# Patient Record
Sex: Male | Born: 1970 | Race: Black or African American | Hispanic: No | Marital: Single | Smoking: Former smoker
Health system: Southern US, Community
[De-identification: ages and names within clinical notes are randomized; demographics above are authoritative.]

## PROBLEM LIST (undated history)

## (undated) ENCOUNTER — Ambulatory Visit: Admission: EM | Source: Home / Self Care

## (undated) DIAGNOSIS — K625 Hemorrhage of anus and rectum: Secondary | ICD-10-CM

## (undated) DIAGNOSIS — I10 Essential (primary) hypertension: Secondary | ICD-10-CM

## (undated) DIAGNOSIS — L259 Unspecified contact dermatitis, unspecified cause: Secondary | ICD-10-CM

## (undated) DIAGNOSIS — M25519 Pain in unspecified shoulder: Secondary | ICD-10-CM

## (undated) DIAGNOSIS — M6281 Muscle weakness (generalized): Secondary | ICD-10-CM

## (undated) HISTORY — PX: TONSILLECTOMY: SUR1361

## (undated) HISTORY — DX: Hemorrhage of anus and rectum: K62.5

## (undated) HISTORY — DX: Unspecified contact dermatitis, unspecified cause: L25.9

## (undated) HISTORY — DX: Muscle weakness (generalized): M62.81

## (undated) HISTORY — DX: Essential (primary) hypertension: I10

## (undated) HISTORY — PX: ROTATOR CUFF REPAIR: SHX139

## (undated) HISTORY — PX: ACHILLES TENDON REPAIR: SUR1153

## (undated) HISTORY — DX: Pain in unspecified shoulder: M25.519

## (undated) SURGERY — COLONOSCOPY
Anesthesia: Moderate Sedation

---

## 2005-05-04 ENCOUNTER — Ambulatory Visit: Payer: Self-pay | Admitting: Orthopedic Surgery

## 2006-06-15 ENCOUNTER — Ambulatory Visit (HOSPITAL_BASED_OUTPATIENT_CLINIC_OR_DEPARTMENT_OTHER): Admission: RE | Admit: 2006-06-15 | Discharge: 2006-06-15 | Payer: Self-pay | Admitting: Orthopedic Surgery

## 2007-08-15 ENCOUNTER — Emergency Department (HOSPITAL_COMMUNITY): Admission: EM | Admit: 2007-08-15 | Discharge: 2007-08-15 | Payer: Self-pay | Admitting: Emergency Medicine

## 2007-08-17 ENCOUNTER — Encounter: Admission: RE | Admit: 2007-08-17 | Discharge: 2007-08-17 | Payer: Self-pay | Admitting: Sports Medicine

## 2007-08-19 ENCOUNTER — Encounter: Admission: RE | Admit: 2007-08-19 | Discharge: 2007-08-19 | Payer: Self-pay | Admitting: Sports Medicine

## 2010-05-23 ENCOUNTER — Emergency Department (HOSPITAL_COMMUNITY): Admission: EM | Admit: 2010-05-23 | Discharge: 2010-05-23 | Payer: Self-pay | Admitting: Emergency Medicine

## 2011-02-28 LAB — URINALYSIS, ROUTINE W REFLEX MICROSCOPIC
Bilirubin Urine: NEGATIVE
Glucose, UA: NEGATIVE mg/dL
Nitrite: NEGATIVE
Specific Gravity, Urine: >1.03 — ABNORMAL HIGH (ref 1.005–1.030)
pH: 5.5 (ref 5.0–8.0)

## 2011-02-28 LAB — HEPATIC FUNCTION PANEL
ALT: 37 U/L (ref 0–53)
AST: 38 U/L — ABNORMAL HIGH (ref 0–37)
Albumin: 4.2 g/dL (ref 3.5–5.2)
Alkaline Phosphatase: 74 U/L (ref 39–117)
Total Protein: 7.3 g/dL (ref 6.0–8.3)

## 2011-02-28 LAB — DIFFERENTIAL
Lymphocytes Relative: 38 % (ref 12–46)
Lymphs Abs: 2.8 10*3/uL (ref 0.7–4.0)
Neutrophils Relative %: 51 % (ref 43–77)

## 2011-02-28 LAB — BASIC METABOLIC PANEL
BUN: 11 mg/dL (ref 6–23)
Creatinine, Ser: 1.26 mg/dL (ref 0.4–1.5)
GFR calc non Af Amer: >60 mL/min (ref >60)
Potassium: 3.8 mEq/L (ref 3.5–5.1)

## 2011-02-28 LAB — CBC
HCT: 48.3 % (ref 39.0–52.0)
Platelets: 209 10*3/uL (ref 150–400)
WBC: 7.3 10*3/uL (ref 4.0–10.5)

## 2011-02-28 LAB — URINE MICROSCOPIC-ADD ON

## 2011-04-29 NOTE — Op Note (Signed)
NAMESIDHANT, HELDERMAN            ACCOUNT NO.:  [id number]   MEDICAL RECORD NO.:  [id number]          PATIENT TYPE:  AMB   LOCATION:  DSC                          FACILITY:  [facility]   PHYSICIAN:  Cindee Salt, M.D.       DATE OF BIRTH:  [birth year]   DATE OF PROCEDURE:  06/15/2006  DATE OF DISCHARGE:                                 OPERATIVE REPORT   PREOPERATIVE DIAGNOSIS:  Wrist pain right wrist with probable lunotriquetral  tear.   POSTOPERATIVE DIAGNOSIS:  Wrist pain right wrist with probable  lunotriquetral tear plus injury to the dorsal radiocarpal ligament.   OPERATION:  Right wrist arthroscopy with shrinkage of dorsal capsule, dorsal  radiocarpal ligament, lunotriquetral joint.  Avulsion of dorsal midcarpal  capsule right wrist.   SURGEON:  Dr. Merlyn Lot   ASSISTANT:  RN   ANESTHESIA:  General.   HISTORY:  The patient is a 40 year old male who suffered an injury to his  right wrist.  He has had pain which has not responded to conservative  treatment.  This is ulnar-sided; MRI reveals contrast agent in the dorsal  ulnar aspect of his proximal and mid carpal joints.   PROCEDURE:  The patient was brought to the operating room where an axillary  block was carried out.  This was supplemented with a general anesthetic.  This was done after marking the area of procedure by both the patient and  surgeon.  Questions were encouraged and answered, risks and complications of  the surgery having been discussed preoperatively.  The patient was brought  to the operating room where he was given a general anesthetic, prepped using  DuraPrep.  After 3-minute dry time, he was draped.  The limb was placed in  the arthroscopy tower, 10 pounds of traction applied, the joint inflated to  3/4 portal.  A transverse incision was made through skin only, deepened with  hemostat.  Blunt trocar was used enter the joint.  The joint was inspected.  The volar radiocarpal ligaments were intact.  The  scapholunate ligament was  intact.  There was a large prestyloid recess present.  Triangle  fibrocartilage complex was intact.  An irrigation catheter was placed in 6U,  a 4/5 portal opened after localization with a 22 gauge needle.  Probe was  then introduced.  The triangle fibrocartilage complex a showed normal  trampoline effect.  The dorsal carpal ligament complex showed fraying.  The  midcarpal joint was then inspected after localization inflation with a 22  gauge needle through the radial midcarpal portal.  The portal was opened  transversely, deepened with a hemostat.  Blunt trocar was used enter the  joint.  The joint was inspected.  The STT joint showed no changes.  There  were no articular changes on the distal pole of scaphoid lunate triquetrum  or the proximal aspect of the capitate or hamate.  Significant laxity was  present to the lunotriquetral joint.  Significant material was present on  the dorsal capsule in-folded into the joint.  A mid ulnar portal was then  opened after localization with a 22 gauge needle and  an irrigation catheter  placed in the ulnar midcarpal joint using the 18 gauge needle.  A probe was  introduced.  Significant laxity again was noted to the lunotriquetral joint.  The dorsal and radial carpal ligament was found to be intact to the  triquetrum.  Significant stripping of the dorsal capsule was present from  the hamate and capitate was found to be attached distally.  The area was  then debrided with an Merton Border wand with adequate flow.  Shrinkage performed  of the dorsal capsule of the midcarpal joint had instruments removed, and  these were introduced in the ulnar side of the wrist for observation of the  lunotriquetral joint.  Capsule and ligament were intact but stretched.  A  scope was then placed in the 3/4 portal, a shrinkage of the dorsal capsule.  The lunotriquetral joint was then performed with an Merton Border wand with  adequate flow.  No further  lesions were identified.  The midcarpal joint was  inspected.  Significant tightening of the lunotriquetral joint was present.  The instruments were removed, the portals closed with interrupted 5-0 nylon  sutures.  Sterile compressive dressing and dorsal palmar splint applied.  The patient tolerated the procedure well and was taken to the recovery room  for observation in satisfactory condition.  He is discharged home drains to  return to the [facility] in 1 week on Vicodin.           ______________________________  Cindee Salt, M.D.     GK/MEDQ  D:  06/15/2006  T:  06/15/2006  Job:  [id number]

## 2011-05-21 ENCOUNTER — Emergency Department (HOSPITAL_COMMUNITY)
Admission: EM | Admit: 2011-05-21 | Discharge: 2011-05-21 | Disposition: A | Discharge status: Home / Self Care | Payer: 59 | Attending: Emergency Medicine | Admitting: Emergency Medicine

## 2011-05-21 DIAGNOSIS — R112 Nausea with vomiting, unspecified: Secondary | ICD-10-CM | POA: Insufficient documentation

## 2011-05-21 DIAGNOSIS — R197 Diarrhea, unspecified: Secondary | ICD-10-CM | POA: Insufficient documentation

## 2012-08-22 ENCOUNTER — Encounter (HOSPITAL_COMMUNITY): Payer: Self-pay | Admitting: Emergency Medicine

## 2012-08-22 ENCOUNTER — Emergency Department (HOSPITAL_COMMUNITY)
Admission: EM | Admit: 2012-08-22 | Discharge: 2012-08-22 | Disposition: A | Discharge status: Home / Self Care | Payer: Worker's Compensation | Attending: Emergency Medicine | Admitting: Emergency Medicine

## 2012-08-22 ENCOUNTER — Emergency Department (HOSPITAL_COMMUNITY): Payer: Worker's Compensation

## 2012-08-22 DIAGNOSIS — Y9301 Activity, walking, marching and hiking: Secondary | ICD-10-CM | POA: Insufficient documentation

## 2012-08-22 DIAGNOSIS — Y998 Other external cause status: Secondary | ICD-10-CM | POA: Insufficient documentation

## 2012-08-22 DIAGNOSIS — S93402A Sprain of unspecified ligament of left ankle, initial encounter: Secondary | ICD-10-CM

## 2012-08-22 DIAGNOSIS — X58XXXA Exposure to other specified factors, initial encounter: Secondary | ICD-10-CM | POA: Insufficient documentation

## 2012-08-22 DIAGNOSIS — S93409A Sprain of unspecified ligament of unspecified ankle, initial encounter: Secondary | ICD-10-CM | POA: Insufficient documentation

## 2012-08-22 MED ORDER — HYDROCODONE-ACETAMINOPHEN 5-325 MG PO TABS
1.0000 | ORAL_TABLET | Freq: Once | ORAL | Status: AC
  Administered 2012-08-22: 1 via ORAL
  Filled 2012-08-22: qty 1

## 2012-08-22 MED ORDER — IBUPROFEN 800 MG PO TABS: 800.0000 mg | ORAL_TABLET | Freq: Three times a day (TID) | ORAL | Status: AC

## 2012-08-22 NOTE — ED Notes (Signed)
To xray via w/c.

## 2012-08-22 NOTE — ED Notes (Signed)
PT. REPORTS PAIN / SWELLING AT LEFT ANKLE ACCIDENTALLY TWISTED WHILE WORKING ( [employer] [place]) THIS MORNING , AMBULATORY.

## 2012-08-22 NOTE — ED Provider Notes (Signed)
History     CSN: [id number]  Arrival date & time 08/22/12  0248   First MD Initiated Contact with Patient 08/22/12 0327      Chief Complaint  Patient presents with  . Ankle Pain    (Consider location/radiation/quality/duration/timing/severity/associated sxs/prior treatment) HPI History provided by pt.   Pt had an inversion injury to left foot approx 1.5hrs ago when he accidentally stepped on a bottle. Pain at lateral ankle is 6/10 currently.  Associated w/ edema; no paresthesias.  Able to bear weight.     History reviewed. No pertinent past medical history.  Past Surgical History  Procedure Date  . Tonsillectomy     No family history on file.  History  Substance Use Topics  . Smoking status: Never Smoker   . Smokeless tobacco: Not on file  . Alcohol Use: No      Review of Systems  All other systems reviewed and are negative.    Allergies  Review of patient's allergies indicates no known allergies.  Home Medications  No current outpatient prescriptions on file.  BP 137/73  Pulse 62  Temp 97.9 F (36.6 C) (Oral)  Resp 16  SpO2 96%  Physical Exam  Nursing note and vitals reviewed. Constitutional: He is oriented to person, place, and time. He appears well-developed and well-nourished. No distress.  HENT:  Head: Normocephalic and atraumatic.  Eyes:       Normal appearance  Neck: Normal range of motion.  Pulmonary/Chest: Effort normal.  Musculoskeletal: Normal range of motion.       L lateral ankle w/ mild edema; no ecchymosis.  Tenderness over and inferior to lateral malleolus.  Pain w/ passive medial rotation of foot only.  2+ DP pulse and distal sensation intact.  Nml L knee exam.   Neurological: He is alert and oriented to person, place, and time.  Psychiatric: He has a normal mood and affect. His behavior is normal.    ED Course  Procedures (including critical care time)  Labs Reviewed - No data to display Dg Ankle Complete Left  08/22/2012   *RADIOLOGY REPORT*  Clinical Data: Pain after twisting injury.  LEFT ANKLE COMPLETE - 3+ VIEW  Comparison: None.  Findings: Chronic.  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  No acute fracture or subluxation.  No focal bone lesion or bone destruction otherwise identified.  IMPRESSION: Chronic medial malleolar changes.  No acute fractures identified.   Original Report Authenticated By: Marlon Pel, M.D.      1. Sprain of left ankle       MDM  Healthy 41yo M presents w/ inversion injury to left ankle.  Xray neg for acute fx/dislocation.  Pt received one vicodin in ED and d/c'd home w/ ibuprofen.  Ortho tech placed in ASO.   Recommended RICE.          Otilio Miu, [place] 08/22/12 [phone]

## 2012-08-23 NOTE — ED Provider Notes (Signed)
Medical screening examination/treatment/procedure(s) were performed by non-physician practitioner and as supervising physician I was immediately available for consultation/collaboration.    Kayana Thoen L Beverlyn Mcginness, MD 08/23/12 0005 

## 2012-08-27 ENCOUNTER — Other Ambulatory Visit: Payer: Self-pay | Admitting: Medical

## 2012-08-27 NOTE — Telephone Encounter (Signed)
Is this okay to refill? 

## 2012-08-27 NOTE — Telephone Encounter (Signed)
The patient hasn't been seen in over a year. Have her schedule an appointment

## 2012-11-23 ENCOUNTER — Ambulatory Visit (HOSPITAL_COMMUNITY)
Admission: RE | Admit: 2012-11-23 | Discharge: 2012-11-23 | Disposition: A | Discharge status: Home / Self Care | Payer: 59 | Source: Ambulatory Visit | Attending: Orthopedic Surgery | Admitting: Orthopedic Surgery

## 2012-11-23 DIAGNOSIS — M6281 Muscle weakness (generalized): Secondary | ICD-10-CM | POA: Insufficient documentation

## 2012-11-23 DIAGNOSIS — IMO0001 Reserved for inherently not codable concepts without codable children: Secondary | ICD-10-CM | POA: Insufficient documentation

## 2012-11-23 DIAGNOSIS — Z9889 Other specified postprocedural states: Secondary | ICD-10-CM | POA: Insufficient documentation

## 2012-11-23 DIAGNOSIS — M25519 Pain in unspecified shoulder: Secondary | ICD-10-CM | POA: Insufficient documentation

## 2012-11-23 DIAGNOSIS — M25619 Stiffness of unspecified shoulder, not elsewhere classified: Secondary | ICD-10-CM | POA: Insufficient documentation

## 2012-11-23 HISTORY — DX: Pain in unspecified shoulder: M25.519

## 2012-11-23 HISTORY — DX: Muscle weakness (generalized): M62.81

## 2012-11-23 NOTE — Evaluation (Signed)
Occupational Therapy Evaluation  Patient Details  Name: Wayne Weber MRN: [id number] Date of Birth: June 29, 1971  Today's Date: 11/23/2012 Time: 1015-1100 OT Time Calculation (min): 45 min OT Eval 1015-1030 15' Manual Therapy 1030-1045 Heat 1050-1100  Visit#: 1  of 36   Re-eval: 12/21/12  Assessment Diagnosis: S/P Right RCR Surgical Date: 10/08/12 Next MD Visit: unknown Prior Therapy: no  Past Medical History: No past medical history on file. Past Surgical History:  Past Surgical History  Procedure Date  . Tonsillectomy     Subjective S:  I just want to get back to a full life without pain in my shoulder.   Pertinent History: Mr. Girgenti began feeling burning pain in his right shoulder at night more than a year ago.  He consulted with Dr. Eulah Pont and had a right rotator cuff repair done on 10/08/12.  He has weaned from the sling and has been referred to occupational therapy for evaluation and treatment following Dr. Greig Right protocol, phase II. Limitations: 11/23/12 he is 6.5 weeks postop, but begin with Phase II (limit to 70 abd and 30 ER PROM) protocol, per MD order. Progess protocol as indicated on scanned protocol , 12/20 increase to 90 abd and 45 ER, 12/27 can begin AAROM with ER to 80, see scanned protocol for specific details. Special Tests: UEFI:  30/80 = 37% I level Patient Stated Goals: I want to get back to living an active life and pain free. Pain Assessment Currently in Pain?: Yes Pain Score:   5 Pain Orientation: Right;Anterior Pain Type: Acute pain Pain Radiating Towards: pain is 0 at rest, but increases at end of available range.    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, reaching overhead, pushing, pulling, climbing steps Leisure: Hobbies-yes (Comment) Comments: working out, Public house manager ADL/Vision/Perception ADL ADL Comments: He has not utilized his right arm with daily activities above waist height Dominant Hand: Right  Cognition/Observation Cognition Overall Cognitive Status: Appears within functional limits for tasks assessed  Sensation/Coordination/Edema Sensation Light Touch: Appears Intact Coordination Gross Motor Movements are Fluid and Coordinated: Yes Fine Motor Movements are Fluid and Coordinated: Yes  Additional Assessments RUE AROM (degrees) RUE Overall AROM Comments: assessed in supine Right Shoulder Flexion: 30 Degrees Right Shoulder ABduction: 35 Degrees Right Shoulder Internal Rotation: 80 Degrees Right Shoulder External Rotation: 0 Degrees RUE PROM (degrees) RUE Overall PROM Comments: assessed in supine, ER/IR with shoulder adducted Right Shoulder Flexion: 75 Degrees Right Shoulder ABduction: 70 Degrees Right Shoulder Internal Rotation: 80 Degrees Right Shoulder External Rotation: 0 Degrees RUE Strength RUE Overall Strength Comments: strength not assessed per protocol Palpation Palpation: moderate fascial restrictions noted in right scapular region, anterior deltoid region, and upper arm     Exercise/Treatments    Modalities Modalities: Moist Heat Manual Therapy Manual Therapy: Myofascial release Myofascial Release: MFR and manual stretching to right upper arm, scapular, and shoulder region to decrease pain and fascial restrictions and increase pain free mobility within constratints of protocol.   Moist Heat Therapy Number Minutes Moist Heat: 10 Minutes Moist Heat Location: Shoulder  Occupational Therapy Assessment and Plan OT Assessment and  Plan Clinical Impression Statement: A:  Patient with decreased  A/PROM, strength and increased pain and fascial restrictions in his dominant right shoulder s/p rotator cuff repair surgery on 10/08/12.  Patient's deficits are causing decreased I with B/IADLs, work, and leisure activities.  Pt will benefit from skilled therapeutic intervention in order to improve on the following deficits: Decreased range of motion;Decreased strength;Increased fascial restricitons;Increased muscle spasms;Pain;Impaired UE functional use Rehab Potential: Good OT Frequency: Min 3X/week OT Duration: 12 weeks OT Treatment/Interventions: Self-care/ADL training;Therapeutic exercise;Manual therapy;Patient/family education;Therapeutic activities;Modalities OT Plan: P:  Skilled OT intervention to decrease pain and restrictions in his right shoulder region and increase A/PROM, strength to [facility] for return to prior level of I with B/IADLs, leisure, and work activities.  Treatment Plan:  Follow Protocol.  PROM in supine, isometrics in supine.  Seated ext, row, elevation.  Inferior, anterior glides distraction, grade II mobilizations.  Progress per protocol.   Goals Short Term Goals Time to Complete Short Term Goals: 6 weeks Short Term Goal 1: Patient will be I with HEP. Short Term Goal 2: Patient will increase right shoulder PROM to [facility] for increased independence with upper body dressing. Short Term Goal 3: Patient will increase right shoulder strength to 3+/5 for increased ability to reach into overhead cabinets. Short Term Goal 4: Patient will decrease pain in his right shoulder to 2/10 when getting dressed. Short Term Goal 5: Patient will decrease fascial restrictions to minimal in his right shoulder region. Long Term Goals Time to Complete Long Term Goals: 12 weeks Long Term Goal 1: Patient will return to prior level of independence with daily activities, work, and leisure activities. Long Term Goal 2: Patient will increase  his right shoulder AROM to WNL in order to reach overhead at work. Long Term Goal 3: Patient will increase his right shoulder strength to 5/5 for increased independence lifting boxes overhead at work. Long Term Goal 4: Patient will decrease pain in his right shoulder to 1/10 when reaching overhead. Long Term Goal 5: Patient will decrease fascial restrictions to trace in right shoulder.  Problem List Patient Active Problem List  Diagnosis  . Pain in joint, shoulder region  . Status post rotator cuff repair  . Muscle weakness (generalized)    End of Session Activity Tolerance: Patient tolerated treatment well General Behavior During Session: [facility] for tasks performed Cognition: [facility] for tasks performed OT Plan of Care OT Home Exercise Plan: Pendulums, towel slides.   Shirlean Mylar, OTR/L  11/23/2012, 1:31 PM  Physician Documentation Your signature is required to indicate approval of the treatment plan as stated above.  Please sign and either send electronically or make a copy of this report for your files and return this physician signed original.  Please mark one 1.__approve of plan  2. ___approve of plan with the following conditions.   ______________________________                                                          _____________________ Physician Signature  Date  

## 2012-11-26 ENCOUNTER — Ambulatory Visit (HOSPITAL_COMMUNITY)
Admission: RE | Admit: 2012-11-26 | Discharge: 2012-11-26 | Disposition: A | Discharge status: Home / Self Care | Payer: 59 | Source: Ambulatory Visit | Attending: Orthopedic Surgery | Admitting: Orthopedic Surgery

## 2012-11-26 DIAGNOSIS — M6281 Muscle weakness (generalized): Secondary | ICD-10-CM

## 2012-11-26 DIAGNOSIS — Z9889 Other specified postprocedural states: Secondary | ICD-10-CM

## 2012-11-26 DIAGNOSIS — M25519 Pain in unspecified shoulder: Secondary | ICD-10-CM

## 2012-11-26 NOTE — Progress Notes (Signed)
Occupational Therapy Treatment Patient Details  Name: Wayne Weber MRN: [id number] Date of Birth: 20-Nov-1971  Today's Date: 11/26/2012 Time: 5284-1324 OT Time Calculation (min): 35 min Manual Therapy 403-425 22' Therapeutic Exercise 426-438 12'  Visit#: 2  of 36   Re-eval: 12/21/12 Assessment Diagnosis: S/P Right RCR Surgical Date: 10/08/12   Subjective Symptoms/Limitations Symptoms: S:  I can tell it is not healed, it is uncomfortable. Limitations: 11/23/12 he is 6.5 weeks postop, but begin with Phase II (limit to 70 abd and 30 ER PROM) protocol, per MD order. Progess protocol as indicated on scanned protocol , 12/20 increase to 90 abd and 45 ER, 12/27 can begin AAROM with ER to 80, see scanned protocol for specific details Pain Assessment Currently in Pain?: Yes Pain Score:   4 Pain Location: Shoulder Pain Orientation: Right Pain Type: Acute pain  Precautions/Restrictions  Precautions Precautions: Shoulder (RCR - refer to protocol in scanned portion of this chart)  Exercise/Treatments Supine Protraction: PROM;10 reps Horizontal ABduction: PROM;10 reps External Rotation: PROM;10 reps Internal Rotation: PROM;10 reps Flexion: PROM;10 reps ABduction: PROM;10 reps Seated Elevation: AROM;10 reps Extension: AROM;10 reps Row: AROM;10 reps ROM / Strengthening / Isometric Strengthening Anterior Glide: 5x5' Other ROM/Strengthening Exercises: inferior glide 5x5' Flexion: Supine;5X5" Extension: Supine;5X5" External Rotation: Supine;5X5" Internal Rotation: Supine;5X5" ABduction: Supine;5X5" ADduction: Supine;5X5"         Manual Therapy Manual Therapy: Myofascial release Myofascial Release: MFR and manual stretching to right upper arm, scapular, and shoulder region to decrease pain and fascial restrictions and increase pain free mobility within constratints of protocol  Occupational Therapy Assessment and Plan OT Assessment and Plan Clinical Impression  Statement: A:  PROM within limits of protocol, isometrics, scapular AROM and glides were added today.  Patient with no increase in pain just "feel uncomfortable" mostly in posterior shoulder/upper arm. OT Plan: P:  Increase reps and hold time with isometrics.   Goals Short Term Goals Time to Complete Short Term Goals: 6 weeks Short Term Goal 1: Patient will be I with HEP. Short Term Goal 2: Patient will increase right shoulder PROM to [facility] for increased independence with upper body dressing. Short Term Goal 3: Patient will increase right shoulder strength to 3+/5 for increased ability to reach into overhead cabinets. Short Term Goal 4: Patient will decrease pain in his right shoulder to 2/10 when getting dressed. Short Term Goal 5: Patient will decrease fascial restrictions to minimal in his right shoulder region. Long Term Goals Time to Complete Long Term Goals: 12 weeks Long Term Goal 1: Patient will return to prior level of independence with daily activities, work, and leisure activities. Long Term Goal 2: Patient will increase his right shoulder AROM to WNL in order to reach overhead at work. Long Term Goal 3: Patient will increase his right shoulder strength to 5/5 for increased independence lifting boxes overhead at work. Long Term Goal 4: Patient will decrease pain in his right shoulder to 1/10 when reaching overhead. Long Term Goal 5: Patient will decrease fascial restrictions to trace in right shoulder.  Problem List Patient Active Problem List  Diagnosis  . Pain in joint, shoulder region  . Status post rotator cuff repair  . Muscle weakness (generalized)    End of Session Activity Tolerance: Patient tolerated treatment well General Behavior During Session: [facility] for tasks performed Cognition: [facility] for tasks performed  GO    [name], Arnav Cregg L 11/26/2012, 4:51 PM

## 2012-11-28 ENCOUNTER — Ambulatory Visit (HOSPITAL_COMMUNITY)
Admission: RE | Admit: 2012-11-28 | Discharge: 2012-11-28 | Disposition: A | Discharge status: Home / Self Care | Payer: 59 | Source: Ambulatory Visit | Attending: Orthopedic Surgery | Admitting: Orthopedic Surgery

## 2012-11-28 DIAGNOSIS — M25519 Pain in unspecified shoulder: Secondary | ICD-10-CM

## 2012-11-28 DIAGNOSIS — M6281 Muscle weakness (generalized): Secondary | ICD-10-CM

## 2012-11-28 DIAGNOSIS — Z9889 Other specified postprocedural states: Secondary | ICD-10-CM

## 2012-11-28 NOTE — Progress Notes (Addendum)
Occupational Therapy Treatment Patient Details  Name: TEX CONROY MRN: [id number] Date of Birth: [birth year]  Today's Date: 11/28/2012 Time: 4098-1191 OT Time Calculation (min): 38 min Manual Therapy 4782-9562 27' Therapeutic Exercise 1308-6578 10'  Visit#: 3  of 36   Re-eval: 12/21/12 Assessment Diagnosis: S/P Right RCR Surgical Date: 10/08/12   Subjective Symptoms/Limitations Symptoms: S:  It feels about the same. Pain Assessment Currently in Pain?: Yes Pain Score:   4  Precautions/Restrictions  Precautions Precautions: Shoulder (RCR - refer to protocol in scanned portion of this chart)  Exercise/Treatments Supine Protraction: PROM;10 reps Horizontal ABduction: PROM;10 reps External Rotation: PROM;10 reps Internal Rotation: PROM;10 reps Flexion: PROM;10 reps ABduction: PROM;10 reps Seated Elevation: AROM;10 reps Extension: AROM;10 reps Row: AROM;10 reps Therapy Ball Flexion: 10 reps ABduction: 10 reps ROM / Strengthening / Isometric Strengthening Anterior Glide: 5x5' Other ROM/Strengthening Exercises: inferior glide 5x5' Flexion: Supine;5X5" Extension: Supine;5X5" External Rotation: Supine;5X5" Internal Rotation: Supine;5X5" ABduction: Supine;5X5" ADduction: Supine;5X5"    Manual Therapy Manual Therapy: Myofascial release Myofascial Release: MFR and manual stretching to right upper arm, scapular, and shoulder region to decrease pain and fascial restrictions and increase pain free mobility within constratints of protocol   Occupational Therapy Assessment and Plan OT Assessment and Plan Clinical Impression Statement: A:  Added ball stretches flexion and abduction within limits of protocol.  No change in pain after manual and exercises. OT Plan: P:  Initiate AAROM and pulleys on 12/27.   Goals Short Term Goals Time to Complete Short Term Goals: 6 weeks Short Term Goal 1: Patient will be I with HEP. Short Term Goal 2: Patient will increase right  shoulder PROM to [facility] for increased independence with upper body dressing. Short Term Goal 3: Patient will increase right shoulder strength to 3+/5 for increased ability to reach into overhead cabinets. Short Term Goal 4: Patient will decrease pain in his right shoulder to 2/10 when getting dressed. Short Term Goal 5: Patient will decrease fascial restrictions to minimal in his right shoulder region. Long Term Goals Time to Complete Long Term Goals: 12 weeks Long Term Goal 1: Patient will return to prior level of independence with daily activities, work, and leisure activities. Long Term Goal 2: Patient will increase his right shoulder AROM to WNL in order to reach overhead at work. Long Term Goal 3: Patient will increase his right shoulder strength to 5/5 for increased independence lifting boxes overhead at work. Long Term Goal 4: Patient will decrease pain in his right shoulder to 1/10 when reaching overhead. Long Term Goal 5: Patient will decrease fascial restrictions to trace in right shoulder.  Problem List Patient Active Problem List  Diagnosis  . Pain in joint, shoulder region  . Status post rotator cuff repair  . Muscle weakness (generalized)    End of Session Activity Tolerance: Patient tolerated treatment well General Behavior During Session: [facility] for tasks performed Cognition: [facility] for tasks performed  GO    [name], Vaniya Augspurger L 11/28/2012, 12:02 PM

## 2012-11-30 ENCOUNTER — Ambulatory Visit (HOSPITAL_COMMUNITY)
Admission: RE | Admit: 2012-11-30 | Discharge: 2012-11-30 | Disposition: A | Discharge status: Home / Self Care | Payer: 59 | Source: Ambulatory Visit | Attending: Orthopedic Surgery | Admitting: Orthopedic Surgery

## 2012-11-30 DIAGNOSIS — M25519 Pain in unspecified shoulder: Secondary | ICD-10-CM

## 2012-11-30 DIAGNOSIS — M6281 Muscle weakness (generalized): Secondary | ICD-10-CM

## 2012-11-30 DIAGNOSIS — Z9889 Other specified postprocedural states: Secondary | ICD-10-CM

## 2012-11-30 NOTE — Progress Notes (Signed)
Occupational Therapy Treatment Patient Details  Name: Wayne Weber MRN: [id number] Date of Birth: 05-Nov-1971  Today's Date: 11/30/2012 Time: 4098-1191 OT Time Calculation (min): 34 min Manual Therapy 4782-9562 17' Therapeutic Exercise 1024-1140 14'  Visit#: 4  of 36   Re-eval: 12/21/12 Assessment Diagnosis: S/P Right RCR Surgical Date: 10/08/12  Authorization:    Authorization Time Period:    Authorization Visit#:   of    Subjective Symptoms/Limitations Symptoms: S:  This arm is still not right. Pain Assessment Currently in Pain?: No/denies Pain Score: 0-No pain  Precautions/Restrictions  Precautions Precautions: Shoulder (RCR - refer to protocol in scanned portion of this chart)  Exercise/Treatments Supine Protraction: PROM;10 reps Horizontal ABduction: PROM;10 reps External Rotation: PROM;10 reps Internal Rotation: PROM;10 reps Flexion: PROM;10 reps ABduction: PROM;10 reps Seated Elevation: AROM;10 reps Extension: AROM;10 reps Row: AROM;10 reps Therapy Ball Flexion: 10 reps ABduction: 10 reps ROM / Strengthening / Isometric Strengthening Anterior Glide: 5x5' Other ROM/Strengthening Exercises: inferior glide 5x5' Flexion: Supine;5X5" Extension: Supine;5X5" External Rotation: Supine;5X5" Internal Rotation: Supine;5X5" ABduction: Supine;5X5" ADduction: Supine;5X5"        Manual Therapy Manual Therapy: Myofascial release Myofascial Release: MFR and manual stretching to right upper arm, scapular, and shoulder region to decrease pain and fascial restrictions and increase pain free mobility within constratints of protocol   Occupational Therapy Assessment and Plan OT Assessment and Plan Clinical Impression Statement: A:  Increased ADB PROM to 90 degrees and ER to 45 degrees per protocol.  Unable to get to ER limits secondary to tight capsule. OT Plan: P: Initiate AAROM and pulleys on 12/27   Goals Short Term Goals Time to Complete Short Term  Goals: 6 weeks Short Term Goal 1: Patient will be I with HEP. Short Term Goal 2: Patient will increase right shoulder PROM to [facility] for increased independence with upper body dressing. Short Term Goal 3: Patient will increase right shoulder strength to 3+/5 for increased ability to reach into overhead cabinets. Short Term Goal 4: Patient will decrease pain in his right shoulder to 2/10 when getting dressed. Short Term Goal 5: Patient will decrease fascial restrictions to minimal in his right shoulder region. Long Term Goals Time to Complete Long Term Goals: 12 weeks Long Term Goal 1: Patient will return to prior level of independence with daily activities, work, and leisure activities. Long Term Goal 2: Patient will increase his right shoulder AROM to WNL in order to reach overhead at work. Long Term Goal 3: Patient will increase his right shoulder strength to 5/5 for increased independence lifting boxes overhead at work. Long Term Goal 4: Patient will decrease pain in his right shoulder to 1/10 when reaching overhead. Long Term Goal 5: Patient will decrease fascial restrictions to trace in right shoulder.  Problem List Patient Active Problem List  Diagnosis  . Pain in joint, shoulder region  . Status post rotator cuff repair  . Muscle weakness (generalized)    End of Session Activity Tolerance: Patient tolerated treatment well General Behavior During Session: [facility] for tasks performed Cognition: [facility] for tasks performed  GO    [name], Wayne Weber L 11/30/2012, 11:44 AM

## 2012-12-04 ENCOUNTER — Ambulatory Visit (HOSPITAL_COMMUNITY)
Admission: RE | Admit: 2012-12-04 | Discharge: 2012-12-04 | Disposition: A | Discharge status: Home / Self Care | Payer: 59 | Source: Ambulatory Visit | Attending: Orthopedic Surgery | Admitting: Orthopedic Surgery

## 2012-12-04 DIAGNOSIS — M25519 Pain in unspecified shoulder: Secondary | ICD-10-CM

## 2012-12-04 DIAGNOSIS — M6281 Muscle weakness (generalized): Secondary | ICD-10-CM

## 2012-12-04 DIAGNOSIS — Z9889 Other specified postprocedural states: Secondary | ICD-10-CM

## 2012-12-04 NOTE — Progress Notes (Signed)
Occupational Therapy Treatment Patient Details  Name: Wayne Weber MRN: [id number] Date of Birth: Feb 02, 1971  Today's Date: 12/04/2012 Time: 9147-8295 OT Time Calculation (min): 29 min Manual Therapy 6213-0865 14' Therapeutic Exercise 7846-9629 14' Visit#: 5  of 36   Re-eval: 12/21/12 Assessment Diagnosis: S/P Right RCR Surgical Date: 10/08/12   Subjective Symptoms/Limitations Symptoms: S:   It just feels tight no real pain. Limitations: 11/23/12 he is 6.5 weeks postop, but begin with Phase II (limit to 70 abd and 30 ER PROM) protocol, per MD order. Progess protocol as indicated on scanned protocol , 12/20 increase to 90 abd and 45 ER, 12/27 can begin AAROM with ER to 80, see scanned protocol for specific details Pain Assessment Currently in Pain?: No/denies Pain Score: 0-No pain  Precautions/Restrictions  Precautions Precautions: Shoulder (RCR - refer to protocol in scanned portion of this chart)  Exercise/Treatments Supine Protraction: PROM;10 reps Horizontal ABduction: PROM;10 reps External Rotation: PROM;10 reps Internal Rotation: PROM;10 reps Flexion: PROM;10 reps ABduction: PROM;10 reps Seated Elevation: AROM;10 reps Extension: AROM;10 reps Row: AROM;10 reps Therapy Ball Flexion: 20 reps ABduction: 20 reps ROM / Strengthening / Isometric Strengthening Anterior Glide: 5x5' Other ROM/Strengthening Exercises: inferior glide 5x5' Flexion: Supine;Other (comment) (10x 5") Extension: Supine;Other (comment) (10x5") External Rotation: Supine;Other (comment) (10x5") Internal Rotation: Supine;Other (comment) (10x5") ABduction: Supine;Other (comment) (10x5") ADduction: Supine;Other (comment) (10x5")          Manual Therapy Manual Therapy: Myofascial release Myofascial Release: MFR and manual stretching to right upper arm, scapular, and shoulder region to decrease pain and fascial restrictions and increase pain free mobility within constratints of  protocol  Occupational Therapy Assessment and Plan OT Assessment and Plan Clinical Impression Statement: A:  Increase reps with isometric and stretches with ball within limits of protocol. OT Plan: P:  According to protocol may begin AAROM on 12/07/12.   Goals Short Term Goals Time to Complete Short Term Goals: 6 weeks Short Term Goal 1: Patient will be I with HEP. Short Term Goal 2: Patient will increase right shoulder PROM to [facility] for increased independence with upper body dressing. Short Term Goal 3: Patient will increase right shoulder strength to 3+/5 for increased ability to reach into overhead cabinets. Short Term Goal 4: Patient will decrease pain in his right shoulder to 2/10 when getting dressed. Short Term Goal 5: Patient will decrease fascial restrictions to minimal in his right shoulder region. Long Term Goals Time to Complete Long Term Goals: 12 weeks Long Term Goal 1: Patient will return to prior level of independence with daily activities, work, and leisure activities. Long Term Goal 2: Patient will increase his right shoulder AROM to WNL in order to reach overhead at work. Long Term Goal 3: Patient will increase his right shoulder strength to 5/5 for increased independence lifting boxes overhead at work. Long Term Goal 4: Patient will decrease pain in his right shoulder to 1/10 when reaching overhead. Long Term Goal 5: Patient will decrease fascial restrictions to trace in right shoulder.  Problem List Patient Active Problem List  Diagnosis  . Pain in joint, shoulder region  . Status post rotator cuff repair  . Muscle weakness (generalized)    End of Session Activity Tolerance: Patient tolerated treatment well General Behavior During Session: [facility] for tasks performed Cognition: [facility] for tasks performed  GO    [name], Francella Barnett L 12/04/2012, 11:41 AM

## 2012-12-07 ENCOUNTER — Ambulatory Visit (HOSPITAL_COMMUNITY)
Admission: RE | Admit: 2012-12-07 | Discharge: 2012-12-07 | Disposition: A | Discharge status: Home / Self Care | Payer: 59 | Source: Ambulatory Visit | Attending: Orthopedic Surgery | Admitting: Orthopedic Surgery

## 2012-12-07 DIAGNOSIS — M6281 Muscle weakness (generalized): Secondary | ICD-10-CM

## 2012-12-07 DIAGNOSIS — Z9889 Other specified postprocedural states: Secondary | ICD-10-CM

## 2012-12-07 DIAGNOSIS — M25519 Pain in unspecified shoulder: Secondary | ICD-10-CM

## 2012-12-07 NOTE — Progress Notes (Signed)
Occupational Therapy Treatment Patient Details  Name: Wayne Weber MRN: [id number] Date of Birth: 1971/03/20  Today's Date: 12/07/2012 Time: 2952-8413 OT Time Calculation (min): 40 min Manual Therapy 2440-1027 20' Therapeutic Exercises [phone] 20' Visit#: 6  of 36   Re-eval: 12/21/12    Subjective S:  It still feels really stiff, in the back and the front.   Pain Assessment Currently in Pain?: Yes Pain Score:   1 Pain Location: Shoulder Pain Orientation: Right Pain Type: Acute pain Pain Radiating Towards: stiffness  Precautions/Restrictions   11/23/12 he is 6.5 weeks postop, but begin with Phase II (limit to 70 abd and 30 ER PROM) protocol, per MD order. Progess protocol as indicated on scanned protocol , 12/20 increase to 90 abd and 45 ER, 12/27 can begin AAROM with ER to 80, see scanned protocol for specific details.   Exercise/Treatments Supine Protraction: PROM;10 reps Horizontal ABduction: PROM;10 reps External Rotation: PROM;10 reps;Limitations External Rotation Limitations: limited to 10 degrees Internal Rotation: PROM;10 reps Flexion: PROM;10 reps ABduction: PROM;10 reps Seated Elevation: AROM;15 reps Extension: AROM;15 reps Row: AROM;15 reps Other Seated Exercises: elbow flexion, extension, supination, pronation, wrist flexion, extension x 10 reps Therapy Ball Flexion: 20 reps ABduction: 20 reps ROM / Strengthening / Isometric Strengthening Anterior Glide: 5x5" Caudal Glide: 5x5" Other ROM/Strengthening Exercises: inferior glide 5 x 5" Flexion: Supine;5X5" Extension: Supine;5X5" External Rotation: Supine;5X5" Internal Rotation: Supine;5X5" ABduction: Supine;5X5" ADduction: Supine;5X5"    Manual Therapy Manual Therapy: Myofascial release Myofascial Release: MFR and manual stretching to right upper arm, scapular, and shoulder region to decrease pain and fascial restrictions and increase pain free mobility within constratints of protocol  [phone]  Occupational Therapy Assessment and Plan OT Assessment and Plan Clinical Impression Statement: A:  Patient has most restrictions and limitations with ER and abduction.  Added caudal glide and increased isometric contraction time to 5" this date.   OT Plan: P:  Begin AAROM for flexion and internal rotation, abduction and external rotation required continued PROM prior to beginning AAROM.   Goals Short Term Goals Time to Complete Short Term Goals: 6 weeks Short Term Goal 1: Patient will be I with HEP. Short Term Goal 1 Progress: Progressing toward goal Short Term Goal 2: Patient will increase right shoulder PROM to [facility] for increased independence with upper body dressing. Short Term Goal 2 Progress: Progressing toward goal Short Term Goal 3: Patient will increase right shoulder strength to 3+/5 for increased ability to reach into overhead cabinets. Short Term Goal 3 Progress: Progressing toward goal Short Term Goal 4: Patient will decrease pain in his right shoulder to 2/10 when getting dressed. Short Term Goal 4 Progress: Progressing toward goal Short Term Goal 5: Patient will decrease fascial restrictions to minimal in his right shoulder region. Short Term Goal 5 Progress: Progressing toward goal Long Term Goals Time to Complete Long Term Goals: 12 weeks Long Term Goal 1: Patient will return to prior level of independence with daily activities, work, and leisure activities. Long Term Goal 1 Progress: Progressing toward goal Long Term Goal 2: Patient will increase his right shoulder AROM to WNL in order to reach overhead at work. Long Term Goal 2 Progress: Progressing toward goal Long Term Goal 3: Patient will increase his right shoulder strength to 5/5 for increased independence lifting boxes overhead at work. Long Term Goal 3 Progress: Progressing toward goal Long Term Goal 4: Patient will decrease pain in his right shoulder to 1/10 when reaching overhead. Long Term Goal 4  Progress:  Progressing toward goal Long Term Goal 5: Patient will decrease fascial restrictions to trace in right shoulder. Long Term Goal 5 Progress: Progressing toward goal  Problem List Patient Active Problem List  Diagnosis  . Pain in joint, shoulder region  . Status post rotator cuff repair  . Muscle weakness (generalized)    End of Session Activity Tolerance: Patient tolerated treatment well General Behavior During Session: [facility] for tasks performed Cognition: [facility] for tasks performed  Shirlean Mylar, OTR/L  12/07/2012, 1:45 PM

## 2012-12-10 ENCOUNTER — Ambulatory Visit (HOSPITAL_COMMUNITY)
Admission: RE | Admit: 2012-12-10 | Discharge: 2012-12-10 | Disposition: A | Discharge status: Home / Self Care | Payer: 59 | Source: Ambulatory Visit | Attending: Occupational Therapy | Admitting: Occupational Therapy

## 2012-12-10 DIAGNOSIS — M25519 Pain in unspecified shoulder: Secondary | ICD-10-CM

## 2012-12-10 DIAGNOSIS — Z9889 Other specified postprocedural states: Secondary | ICD-10-CM

## 2012-12-10 DIAGNOSIS — M6281 Muscle weakness (generalized): Secondary | ICD-10-CM

## 2012-12-10 NOTE — Progress Notes (Signed)
Occupational Therapy Treatment Patient Details  Name: Wayne Weber MRN: [id number] Date of Birth: 24-Oct-1971  Today's Date: 12/10/2012 Time: 4098-1191 OT Time Calculation (min): 31 min Manual Therapy 1026-1040 14' Therapeutic Exercise 1041-1057 16'  Visit#: 7  of 36   Re-eval: 11/20/12 Assessment Diagnosis: S/P Right RCR Surgical Date: 10/08/12  Authorization:    Authorization Time Period:    Authorization Visit#:   of    Subjective Symptoms/Limitations Symptoms: S:  It is just stiff, no real pain. Pain Assessment Currently in Pain?: No/denies Pain Score: 0-No pain Pain Location: Shoulder Pain Orientation: Right Pain Type: Acute pain  Precautions/Restrictions  Precautions Precautions: Shoulder (RCR - refer to protocol in scanned portion of this chart)  Exercise/Treatments Supine Protraction: PROM;AAROM;10 reps Horizontal ABduction: PROM;AAROM;10 reps External Rotation: PROM;AAROM;10 reps Internal Rotation: PROM;AAROM;10 reps Flexion: PROM;AAROM;10 reps ABduction: PROM;AAROM;10 reps Seated Elevation: AROM;15 reps Extension: AROM;15 reps Row: AROM;15 reps Other Seated Exercises: elbow flexion, extension, supination, pronation, wrist flexion, extension x 10 reps Therapy Ball Flexion: 20 reps ABduction: 20 reps ROM / Strengthening / Isometric Strengthening Anterior Glide: 5x5" Caudal Glide: 5x5" Other ROM/Strengthening Exercises: inferior glide 5 x 5" Flexion: Other (comment) (d/c ) Extension: Other (comment) (d/c) External Rotation: Other (comment) (d/c) Internal Rotation: Other (comment) (d/c) ABduction: Other (comment) (d/c) ADduction: Other (comment) (d/c)      Manual Therapy Manual Therapy: Myofascial release Myofascial Release: MFR and manual stretching to right upper arm, scapular, and shoulder region to decrease pain and fascial restrictions and increase pain free mobility within constratints of protocol   Occupational Therapy Assessment  and Plan OT Assessment and Plan Clinical Impression Statement: A:  Per protocol began AAROM in supine and d/c isometrics.  Pt. with good form with AAROM. OT Plan: P: Increase reps with AAROM, begin serratus punch supine and pulleys.   Goals Short Term Goals Time to Complete Short Term Goals: 6 weeks Short Term Goal 1: Patient will be I with HEP. Short Term Goal 2: Patient will increase right shoulder PROM to [facility] for increased independence with upper body dressing. Short Term Goal 3: Patient will increase right shoulder strength to 3+/5 for increased ability to reach into overhead cabinets. Short Term Goal 4: Patient will decrease pain in his right shoulder to 2/10 when getting dressed. Short Term Goal 5: Patient will decrease fascial restrictions to minimal in his right shoulder region. Long Term Goals Time to Complete Long Term Goals: 12 weeks Long Term Goal 1: Patient will return to prior level of independence with daily activities, work, and leisure activities. Long Term Goal 2: Patient will increase his right shoulder AROM to WNL in order to reach overhead at work. Long Term Goal 3: Patient will increase his right shoulder strength to 5/5 for increased independence lifting boxes overhead at work. Long Term Goal 4: Patient will decrease pain in his right shoulder to 1/10 when reaching overhead. Long Term Goal 5: Patient will decrease fascial restrictions to trace in right shoulder.  Problem List Patient Active Problem List  Diagnosis  . Pain in joint, shoulder region  . Status post rotator cuff repair  . Muscle weakness (generalized)    End of Session Activity Tolerance: Patient tolerated treatment well General Behavior During Session: [facility] for tasks performed Cognition: [facility] for tasks performed  GO    [name], Dereon Williamsen L 12/10/2012, 12:19 PM

## 2012-12-11 ENCOUNTER — Ambulatory Visit (HOSPITAL_COMMUNITY)
Admission: RE | Admit: 2012-12-11 | Discharge: 2012-12-11 | Disposition: A | Discharge status: Home / Self Care | Payer: 59 | Source: Ambulatory Visit | Attending: Orthopedic Surgery | Admitting: Orthopedic Surgery

## 2012-12-11 DIAGNOSIS — Z9889 Other specified postprocedural states: Secondary | ICD-10-CM

## 2012-12-11 DIAGNOSIS — M6281 Muscle weakness (generalized): Secondary | ICD-10-CM

## 2012-12-11 DIAGNOSIS — M25519 Pain in unspecified shoulder: Secondary | ICD-10-CM

## 2012-12-11 NOTE — Progress Notes (Signed)
Occupational Therapy Treatment Patient Details  Name: AJMAL KATHAN MRN: [id number] Date of Birth: [birth year]  Today's Date: 12/11/2012 Time: 1110-1150 OT Time Calculation (min): 40 min Manual Therapy 6962-9528 17' Therapeutic Exercises 1127-1150 23' Visit#: 8  of 36   Re-eval: 12/21/12     Subjective S:  I guess its getting a bit better. (OTR/L discussed progress thus far, showing positionally his ROM from day 1 to present day)   Pain Assessment Currently in Pain?: Yes Pain Score:   1 Pain Orientation: Right Pain Type: Acute pain  Precautions/Restrictions   11/23/12 he is 6.5 weeks postop, but begin with Phase II (limit to 70 abd and 30 ER PROM) protocol, per MD order. Progess protocol as indicated on scanned protocol , 12/20 increase to 90 abd and 45 ER, 12/27 can begin AAROM with ER to 80, see scanned protocol for specific det   Exercise/Treatments Supine Protraction: PROM;10 reps;AAROM;12 reps Horizontal ABduction: PROM;10 reps;AAROM;12 reps External Rotation: PROM;10 reps;AAROM;12 reps External Rotation Limitations: limited to 50% AARIN Internal Rotation: PROM;10 reps;AAROM;12 reps Flexion: PROM;10 reps;AAROM;12 reps ABduction: PROM;10 reps;AAROM;12 reps Other Supine Exercises: serratus anterior punch x 10 Seated Elevation: AROM Extension: AROM;15 reps Row: AROM;15 reps Other Seated Exercises: elbow flexion, extension, supination, pronation, wrist flexion, extension x 15 reps with 2# Therapy Ball Flexion: 25 reps ABduction: 25 reps ROM / Strengthening / Isometric Strengthening Thumb Tacks: 1' Anterior Glide: dc Caudal Glide: dc Prot/Ret//Elev/Dep: 1' Other ROM/Strengthening Exercises: dc        Manual Therapy Manual Therapy: Myofascial release Myofascial Release: MFR and manual stretching to right upper arm, scapular, and shoulder region to decrease pain and fascial restrictions and increase pain free mobility within constratints of protocol   [id number]  Occupational Therapy Assessment and Plan OT Assessment and Plan Clinical Impression Statement: A:  AAROM into flexion, abduction, protraction, and horizontal abduction is [facility], ER remains limited to 50%.  OT Plan: P:  Begin pulleys, increase PROM/AAROM into ER by 10 for increased I with functional activities.    Goals Short Term Goals Time to Complete Short Term Goals: 6 weeks Short Term Goal 1: Patient will be I with HEP. Short Term Goal 1 Progress: Progressing toward goal Short Term Goal 2: Patient will increase right shoulder PROM to [facility] for increased independence with upper body dressing. Short Term Goal 2 Progress: Progressing toward goal Short Term Goal 3: Patient will increase right shoulder strength to 3+/5 for increased ability to reach into overhead cabinets. Short Term Goal 3 Progress: Progressing toward goal Short Term Goal 4: Patient will decrease pain in his right shoulder to 2/10 when getting dressed. Short Term Goal 4 Progress: Progressing toward goal Short Term Goal 5: Patient will decrease fascial restrictions to minimal in his right shoulder region. Short Term Goal 5 Progress: Progressing toward goal Long Term Goals Time to Complete Long Term Goals: 12 weeks Long Term Goal 1: Patient will return to prior level of independence with daily activities, work, and leisure activities. Long Term Goal 1 Progress: Progressing toward goal Long Term Goal 2: Patient will increase his right shoulder AROM to WNL in order to reach overhead at work. Long Term Goal 2 Progress: Progressing toward goal Long Term Goal 3: Patient will increase his right shoulder strength to 5/5 for increased independence lifting boxes overhead at work. Long Term Goal 3 Progress: Progressing toward goal Long Term Goal 4: Patient will decrease pain in his right shoulder to 1/10 when reaching overhead. Long Term Goal 4 Progress: Progressing  toward goal Long Term Goal 5: Patient will decrease fascial  restrictions to trace in right shoulder. Long Term Goal 5 Progress: Progressing toward goal  Problem List Patient Active Problem List  Diagnosis  . Pain in joint, shoulder region  . Status post rotator cuff repair  . Muscle weakness (generalized)    End of Session Activity Tolerance: Patient tolerated treatment well General Behavior During Session: [facility] for tasks performed Cognition: [facility] for tasks performed  GO    Shirlean Mylar, OTR/L  12/11/2012, 12:14 PM

## 2012-12-13 ENCOUNTER — Ambulatory Visit (HOSPITAL_COMMUNITY)
Admission: RE | Admit: 2012-12-13 | Discharge: 2012-12-13 | Disposition: A | Discharge status: Home / Self Care | Payer: No Typology Code available for payment source | Source: Ambulatory Visit | Attending: Orthopedic Surgery | Admitting: Orthopedic Surgery

## 2012-12-13 DIAGNOSIS — Z9889 Other specified postprocedural states: Secondary | ICD-10-CM

## 2012-12-13 DIAGNOSIS — M6281 Muscle weakness (generalized): Secondary | ICD-10-CM | POA: Insufficient documentation

## 2012-12-13 DIAGNOSIS — M25519 Pain in unspecified shoulder: Secondary | ICD-10-CM | POA: Insufficient documentation

## 2012-12-13 DIAGNOSIS — IMO0001 Reserved for inherently not codable concepts without codable children: Secondary | ICD-10-CM | POA: Insufficient documentation

## 2012-12-13 DIAGNOSIS — M25619 Stiffness of unspecified shoulder, not elsewhere classified: Secondary | ICD-10-CM | POA: Insufficient documentation

## 2012-12-13 NOTE — Progress Notes (Signed)
Occupational Therapy Treatment Patient Details  Name: Wayne Weber MRN: [id number] Date of Birth: 06-May-1971  Today's Date: 12/13/2012 Time: 9147-8295 OT Time Calculation (min): 37 min Manual Therapy 6213-0865 18' Therapeutic Exercise 1207-1225 18'  Visit#: 9  of 36   Re-eval: 12/21/12 Assessment Diagnosis: S/P Right RCR Surgical Date: 10/08/12  Authorization:    Authorization Time Period:    Authorization Visit#:   of    Subjective Symptoms/Limitations Symptoms: S:  It is stiff, I hurt when I walk around without the sling.  I went to [place] for about 2 hours, my arm really hurt. Pain Assessment Currently in Pain?: No/denies Pain Score: 0-No pain  Precautions/Restrictions  Precautions Precautions: Shoulder (RCR - refer to protocol in scanned portion of this chart)  Exercise/Treatments Supine Protraction: PROM;10 reps;AAROM;15 reps Horizontal ABduction: PROM;10 reps;AAROM;15 reps External Rotation: PROM;10 reps;AAROM;15 reps External Rotation Limitations: limited to 50% AAROM Internal Rotation: PROM;10 reps;AAROM;15 reps Flexion: PROM;10 reps;AAROM;15 reps ABduction: PROM;10 reps;AAROM;15 reps Other Supine Exercises: serratus anterior punch x 10 Seated Elevation: AROM;15 reps Extension: AROM;15 reps Row: AROM;15 reps Other Seated Exercises: elbow flexion, extension, supination, pronation, wrist flexion, extension x 15 reps with 2# Pulleys Flexion: 1 minute ABduction: 1 minute Therapy Ball Flexion: 25 reps ABduction: 25 reps ROM / Strengthening / Isometric Strengthening Thumb Tacks: 1' Prot/Ret//Elev/Dep: 1'        Manual Therapy Manual Therapy: Myofascial release Myofascial Release: MFR and manual stretching to right upper arm, scapular, and shoulder region to decrease pain and fascial restrictions and increase pain free mobility within constratints of protocol   Occupational Therapy Assessment and Plan OT Assessment and Plan Clinical Impression  Statement: A:  Added pulleys today which pt performed well, no increase in pain and with good form. OT Plan: P:  According to protocol, may begin tband for extension and retraction.   Goals Short Term Goals Time to Complete Short Term Goals: 6 weeks Short Term Goal 1: Patient will be I with HEP. Short Term Goal 2: Patient will increase right shoulder PROM to [facility] for increased independence with upper body dressing. Short Term Goal 3: Patient will increase right shoulder strength to 3+/5 for increased ability to reach into overhead cabinets. Short Term Goal 4: Patient will decrease pain in his right shoulder to 2/10 when getting dressed. Short Term Goal 5: Patient will decrease fascial restrictions to minimal in his right shoulder region. Long Term Goals Time to Complete Long Term Goals: 12 weeks Long Term Goal 1: Patient will return to prior level of independence with daily activities, work, and leisure activities. Long Term Goal 2: Patient will increase his right shoulder AROM to WNL in order to reach overhead at work. Long Term Goal 3: Patient will increase his right shoulder strength to 5/5 for increased independence lifting boxes overhead at work. Long Term Goal 4: Patient will decrease pain in his right shoulder to 1/10 when reaching overhead. Long Term Goal 5: Patient will decrease fascial restrictions to trace in right shoulder.  Problem List Patient Active Problem List  Diagnosis  . Pain in joint, shoulder region  . Status post rotator cuff repair  . Muscle weakness (generalized)    End of Session Activity Tolerance: Patient tolerated treatment well General Behavior During Session: [facility] for tasks performed Cognition: [facility] for tasks performed  GO    [name], [name] 12/13/2012, 12:33 PM

## 2012-12-17 ENCOUNTER — Ambulatory Visit (HOSPITAL_COMMUNITY)
Admission: RE | Admit: 2012-12-17 | Discharge: 2012-12-17 | Disposition: A | Discharge status: Home / Self Care | Payer: No Typology Code available for payment source | Source: Ambulatory Visit | Attending: Orthopedic Surgery | Admitting: Orthopedic Surgery

## 2012-12-17 DIAGNOSIS — Z9889 Other specified postprocedural states: Secondary | ICD-10-CM

## 2012-12-17 DIAGNOSIS — M25519 Pain in unspecified shoulder: Secondary | ICD-10-CM

## 2012-12-17 DIAGNOSIS — M6281 Muscle weakness (generalized): Secondary | ICD-10-CM

## 2012-12-18 ENCOUNTER — Ambulatory Visit (HOSPITAL_COMMUNITY)
Admission: RE | Admit: 2012-12-18 | Discharge: 2012-12-18 | Disposition: A | Discharge status: Home / Self Care | Payer: No Typology Code available for payment source | Source: Ambulatory Visit | Attending: Orthopedic Surgery | Admitting: Orthopedic Surgery

## 2012-12-18 DIAGNOSIS — Z9889 Other specified postprocedural states: Secondary | ICD-10-CM

## 2012-12-18 DIAGNOSIS — M25519 Pain in unspecified shoulder: Secondary | ICD-10-CM

## 2012-12-18 DIAGNOSIS — M6281 Muscle weakness (generalized): Secondary | ICD-10-CM

## 2012-12-18 NOTE — Progress Notes (Signed)
Occupational Therapy Treatment Patient Details  Name: Wayne Weber MRN: [id number] Date of Birth: Apr 20, 1971  Today's Date: 12/18/2012 Time: 0102-7253 OT Time Calculation (min): 42 min Manual Therapy 6644-0347 14' Therapeutic Exercise 1122-1149 27'  Visit#: 11  of 36   Re-eval: 12/21/12 Assessment Diagnosis: S/P Right RCR Surgical Date: 10/08/12  Authorization:    Authorization Time Period:    Authorization Visit#:   of    Subjective Symptoms/Limitations Symptoms: S:  It was hurting last night down in my arm. Pain Assessment Currently in Pain?: No/denies  Precautions/Restrictions  Precautions Precautions: Shoulder (RCR - refer to protocol in scanned portion of this chart)  Exercise/Treatments Supine Protraction: PROM;10 reps;AAROM;15 reps Horizontal ABduction: PROM;10 reps;AAROM;15 reps External Rotation: PROM;10 reps;AAROM;15 reps Internal Rotation: PROM;10 reps;AAROM;15 reps Flexion: PROM;10 reps;AAROM;15 reps ABduction: PROM;10 reps;AAROM;15 reps Other Supine Exercises: serratus anterior punch x 10 Seated Other Seated Exercises: elbow flexion, extension, supination, pronation, wrist flexion, extension x 15 reps with 2# Standing Extension: Theraband;12 reps Theraband Level (Shoulder Extension): Level 2 (Red) Row: Theraband;12 reps Theraband Level (Shoulder Row): Level 2 (Red) Pulleys Flexion: 2 minutes ABduction: 2 minutes Therapy Ball Flexion: 25 reps ABduction: 25 reps ROM / Strengthening / Isometric Strengthening Thumb Tacks: 1' Prot/Ret//Elev/Dep: 1'       Manual Therapy Manual Therapy: Myofascial release Myofascial Release: MFR and manual stretching to right upper arm, scapular, and shoulder region to decrease pain and fascial restrictions and increase pain free mobility within constratints of protocol   Occupational Therapy Assessment and Plan OT Assessment and Plan Clinical Impression Statement: A:  Pt competes all exercises with good form  just cues to decrease pace.  Protocol call for beginning IR/ER with band but pt. does not have full ROM to begin those exercises yet. OT Plan: P:  Reassess.   Goals Short Term Goals Time to Complete Short Term Goals: 6 weeks Short Term Goal 1: Patient will be I with HEP. Short Term Goal 2: Patient will increase right shoulder PROM to [facility] for increased independence with upper body dressing. Short Term Goal 3: Patient will increase right shoulder strength to 3+/5 for increased ability to reach into overhead cabinets. Short Term Goal 4: Patient will decrease pain in his right shoulder to 2/10 when getting dressed. Short Term Goal 5: Patient will decrease fascial restrictions to minimal in his right shoulder region. Long Term Goals Time to Complete Long Term Goals: 12 weeks Long Term Goal 1: Patient will return to prior level of independence with daily activities, work, and leisure activities. Long Term Goal 2: Patient will increase his right shoulder AROM to WNL in order to reach overhead at work. Long Term Goal 3: Patient will increase his right shoulder strength to 5/5 for increased independence lifting boxes overhead at work. Long Term Goal 4: Patient will decrease pain in his right shoulder to 1/10 when reaching overhead. Long Term Goal 5: Patient will decrease fascial restrictions to trace in right shoulder.  Problem List Patient Active Problem List  Diagnosis  . Pain in joint, shoulder region  . Status post rotator cuff repair  . Muscle weakness (generalized)    End of Session Activity Tolerance: Patient tolerated treatment well General Behavior During Session: [facility] for tasks performed Cognition: [facility] for tasks performed  GO    [name], Bernell Haynie L 12/18/2012, 11:54 AM

## 2012-12-18 NOTE — Progress Notes (Addendum)
Occupational Therapy Treatment Patient Details  Name: Wayne Weber MRN: [id number] Date of Birth: [birth year]  Today's Date: 12/18/2012 Time: 4098-1191 51 min Manual Therapy 4782-9562 31'  Therapeutic Exercise 1308-6578 20'     Visit#:  10 of  36  Re-eval: 12/21/12    Authorization:    Authorization Time Period:    Authorization Visit#:   of    Subjective Symptoms/Limitations Symptoms: S:  I was hurting a good bit last night, it is hurting now 7/10. Pain Assessment Currently in Pain?: Yes Pain Score:   7 Pain Location: Shoulder Pain Orientation: Right Pain Type: Acute pain  Precautions/Restrictions  Precautions Precautions: Shoulder (RCR - refer to protocol in scanned portion of this chart)  Exercise/Treatments  12/17/12 1100  Shoulder Exercises: Supine  Protraction PROM;10 reps;AAROM;15 reps  Horizontal ABduction PROM;10 reps;AAROM;15 reps  External Rotation PROM;10 reps;AAROM;15 reps  Internal Rotation PROM;10 reps;AAROM;15 reps  Flexion PROM;10 reps;AAROM;15 reps  ABduction PROM;10 reps;AAROM;15 reps  Other Supine Exercises serratus anterior punch x 10  Shoulder Exercises: Standing  Extension Theraband;10 reps  Theraband Level (Shoulder Extension) Level 2 (Red)  Row Theraband;10 reps  Theraband Level (Shoulder Row) Level 2 (Red)  Shoulder Exercises: Pulleys  Flexion 2 minutes  ABduction 2 minutes  Shoulder Exercises: Therapy Ball  Flexion 25 reps  ABduction 25 reps  Shoulder Exercises: ROM/Strengthening  Thumb Tacks 1'  Prot/Ret//Elev/Dep 1'      Manual Therapy  Manual Therapy: Myofascial release  Myofascial Release: MFR and manual stretching to right upper arm, scapular, and shoulder region to decrease pain and fascial restrictions and increase pain free mobility within constratints of protocol   Occupational Therapy Assessment and Plan  OT Assessment and Plan  Clinical Impression Statement: A:  A: According to protocol added tband for ext and  rows which patient tolerated well and min cues to keep shoulder depressed and slow pace. OT Plan: P: Increase reps with tband and continue to progress as protocol allows   Goals  Short Term Goals  Time to Complete Short Term Goals: 6 weeks  Short Term Goal 1: Patient will be I with HEP.  Short Term Goal 2: Patient will increase right shoulder PROM to [facility] for increased independence with upper body dressing.  Short Term Goal 3: Patient will increase right shoulder strength to 3+/5 for increased ability to reach into overhead cabinets.  Short Term Goal 4: Patient will decrease pain in his right shoulder to 2/10 when getting dressed.  Short Term Goal 5: Patient will decrease fascial restrictions to minimal in his right shoulder region.  Long Term Goals  Time to Complete Long Term Goals: 12 weeks  Long Term Goal 1: Patient will return to prior level of independence with daily activities, work, and leisure activities.  Long Term Goal 2: Patient will increase his right shoulder AROM to WNL in order to reach overhead at work.  Long Term Goal 3: Patient will increase his right shoulder strength to 5/5 for increased independence lifting boxes overhead at work.  Long Term Goal 4: Patient will decrease pain in his right shoulder to 1/10 when reaching overhead.  Long Term Goal 5: Patient will decrease fascial restrictions to trace in right shoulder.        Problem List Patient Active Problem List  Diagnosis  . Pain in joint, shoulder region  . Status post rotator cuff repair  . Muscle weakness (generalized)       GO    Wayne Weber L 12/18/2012, 11:24 AM

## 2012-12-20 ENCOUNTER — Ambulatory Visit (HOSPITAL_COMMUNITY)
Admission: RE | Admit: 2012-12-20 | Discharge: 2012-12-20 | Disposition: A | Discharge status: Home / Self Care | Payer: No Typology Code available for payment source | Source: Ambulatory Visit | Attending: Orthopedic Surgery | Admitting: Orthopedic Surgery

## 2012-12-20 DIAGNOSIS — Z9889 Other specified postprocedural states: Secondary | ICD-10-CM

## 2012-12-20 DIAGNOSIS — M25519 Pain in unspecified shoulder: Secondary | ICD-10-CM

## 2012-12-20 DIAGNOSIS — M6281 Muscle weakness (generalized): Secondary | ICD-10-CM

## 2012-12-20 NOTE — Progress Notes (Signed)
Occupational Therapy Treatment Patient Details  Name: QUINTAVIOUS RINCK MRN: [id number] Date of Birth: 06/25/1971  Today's Date: 12/20/2012 Time: 1105-1200 OT Time Calculation (min): 55 min Manual Therapy 1105-1130 25' Therapeutic Exercises 1130-1200 30' Visit#: 12  of 36   Re-eval: 12/21/12    Subjective S:  I feel pretty good today.  Earlier in the week I was experiencing some pain, but its better now.  Limitations: Phase V of protocol seated AAROM and light restist tband for horizontal abduction and flexion.  Phase VI begins 12/21/12 goal to restore full ROM begin AROM. Pain Assessment Currently in Pain?: No/denies Pain Score: 0-No pain  Precautions/Restrictions   Phase V of protocol seated AAROM and light restist tband for horizontal abduction and flexion.  Phase VI begins 12/21/12 goal to restore full ROM begin AROM.  Exercise/Treatments Supine Protraction: PROM;10 reps;AAROM;20 reps Horizontal ABduction: PROM;10 reps;AAROM;20 reps External Rotation: PROM;10 reps;AAROM;20 reps Internal Rotation: PROM;10 reps;AAROM;20 reps Flexion: PROM;10 reps;AAROM;20 reps ABduction: PROM;10 reps;AAROM;20 reps Other Supine Exercises: serratus anterior punch iwth 1# x 20 Seated Elevation: AROM;15 reps Extension: AROM;15 reps Row: AROM;15 reps Protraction: AAROM;10 reps Horizontal ABduction: AAROM;10 reps External Rotation: AAROM;10 reps Internal Rotation: AAROM;10 reps Flexion: AAROM;10 reps Abduction: AAROM;10 reps Other Seated Exercises: elbow flexion, extension, supination, pronation, wrist flexion, extension x 15 reps with 2# Standing Extension: Theraband;10 reps Theraband Level (Shoulder Extension): Level 3 (Green) Row: Theraband;10 reps Theraband Level (Shoulder Row): Level 3 (Green) Retraction: Theraband;10 reps Theraband Level (Shoulder Retraction): Level 3 (Green) Pulleys Flexion: 3 minutes ABduction: 3 minutes Therapy Ball Flexion: 25 reps ABduction: 25 reps ROM /  Strengthening / Isometric Strengthening Thumb Tacks: 2' Prot/Ret//Elev/Dep: 1'      Manual Therapy Manual Therapy: Myofascial release Myofascial Release: MFR and manual stretching to right SCM, trapezius, scapular, upper arm, and shoulder region to decrease pain and restrictions and increase pain free mobility to [facility].  [id number]  Occupational Therapy Assessment and Plan OT Assessment and Plan Clinical Impression Statement: A:  Added AAROM in seated today.  Patient demonstrated WNL AAROM in seated this date.  Increased time with pulley, thumbtack, and prot/ret//elev/dep exercise for further challenge for patient.  Increased to mod resist green tband for scapular stability exercises.  OT Plan: P:  Reassess and add AROM in supine, per protocol.     Goals Short Term Goals Time to Complete Short Term Goals: 6 weeks Short Term Goal 1: Patient will be I with HEP. Short Term Goal 1 Progress: Progressing toward goal Short Term Goal 2: Patient will increase right shoulder PROM to [facility] for increased independence with upper body dressing. Short Term Goal 2 Progress: Progressing toward goal Short Term Goal 3: Patient will increase right shoulder strength to 3+/5 for increased ability to reach into overhead cabinets. Short Term Goal 3 Progress: Progressing toward goal Short Term Goal 4: Patient will decrease pain in his right shoulder to 2/10 when getting dressed. Short Term Goal 4 Progress: Progressing toward goal Short Term Goal 5: Patient will decrease fascial restrictions to minimal in his right shoulder region. Short Term Goal 5 Progress: Progressing toward goal Long Term Goals Time to Complete Long Term Goals: 12 weeks Long Term Goal 1: Patient will return to prior level of independence with daily activities, work, and leisure activities. Long Term Goal 1 Progress: Progressing toward goal Long Term Goal 2: Patient will increase his right shoulder AROM to WNL in order to reach overhead at  work. Long Term Goal 2 Progress: Progressing toward goal Long Term  Goal 3: Patient will increase his right shoulder strength to 5/5 for increased independence lifting boxes overhead at work. Long Term Goal 3 Progress: Progressing toward goal Long Term Goal 4: Patient will decrease pain in his right shoulder to 1/10 when reaching overhead. Long Term Goal 4 Progress: Progressing toward goal Long Term Goal 5: Patient will decrease fascial restrictions to trace in right shoulder. Long Term Goal 5 Progress: Progressing toward goal  Problem List Patient Active Problem List  Diagnosis  . Pain in joint, shoulder region  . Status post rotator cuff repair  . Muscle weakness (generalized)    End of Session Activity Tolerance: Patient tolerated treatment well General Behavior During Session: [facility] for tasks performed Cognition: [facility] for tasks performed  GO    Shirlean Mylar, OTR/L  12/20/2012, 12:08 PM

## 2012-12-24 ENCOUNTER — Ambulatory Visit (HOSPITAL_COMMUNITY)
Admission: RE | Admit: 2012-12-24 | Discharge: 2012-12-24 | Disposition: A | Discharge status: Home / Self Care | Payer: No Typology Code available for payment source | Source: Ambulatory Visit | Attending: Orthopedic Surgery | Admitting: Orthopedic Surgery

## 2012-12-24 DIAGNOSIS — M6281 Muscle weakness (generalized): Secondary | ICD-10-CM

## 2012-12-24 DIAGNOSIS — Z9889 Other specified postprocedural states: Secondary | ICD-10-CM

## 2012-12-24 DIAGNOSIS — M25519 Pain in unspecified shoulder: Secondary | ICD-10-CM

## 2012-12-24 NOTE — Progress Notes (Signed)
Occupational Therapy Treatment Patient Details  Name: Wayne Weber MRN: [id number] Date of Birth: 1971-01-13  Today's Date: 12/24/2012 Time: 5784-6962 OT Time Calculation (min): 45 min Manual Therapy 9528-4132 20' Reassessment 4401-0272 10' Therapeutic Exercise 1135-1148 15'  Visit#: 13  of 36   Re-eval: 01/21/13 Assessment Diagnosis: S/P Right RCR Surgical Date: 10/08/12  Authorization:    Authorization Time Period:    Authorization Visit#:   of    Subjective Symptoms/Limitations Symptoms: S:  My arm is pretty good today. Limitations: Phase V of protocol seated AAROM and light restist tband for horizontal abduction and flexion. Phase VI begins 12/21/12 goal to restore full ROM begin AROM Pain Assessment Currently in Pain?: No/denies Pain Score: 0-No pain Pain Location: Shoulder Pain Orientation: Right Pain Type: Acute pain  Precautions/Restrictions  Precautions Precautions: Shoulder (RCR - refer to protocol in scanned portion of this chart)  Exercise/Treatments Supine Protraction: PROM;10 reps Horizontal ABduction: PROM;10 reps External Rotation: PROM;10 reps Internal Rotation: PROM;10 reps Flexion: PROM;10 reps ABduction: PROM;10 reps Seated Elevation: AROM;15 reps Extension: AROM;15 reps Row: AROM;15 reps Protraction: AAROM;12 reps Horizontal ABduction: AAROM;12 reps External Rotation: AAROM;12 reps Internal Rotation: AAROM;12 reps Flexion: AAROM;12 reps Abduction: AAROM;12 reps Other Seated Exercises: d/c Standing Extension: Theraband;12 reps Theraband Level (Shoulder Extension): Level 3 (Green) Row: Theraband;12 reps Theraband Level (Shoulder Row): Level 3 (Green) Retraction: Theraband;12 reps Theraband Level (Shoulder Retraction): Level 3 (Green) Pulleys Flexion: 3 minutes ABduction: 3 minutes Therapy Ball Flexion: 25 reps ABduction: 25 reps Right/Left: 5 reps ROM / Strengthening / Isometric Strengthening Thumb Tacks:  2' Prot/Ret//Elev/Dep: 1'        Manual Therapy Manual Therapy: Myofascial release Myofascial Release: MFR and manual stretching to right SCM, trapezius, scapular, upper arm, and shoulder region to decrease pain and restrictions and increase pain free mobility to [facility].   Occupational Therapy Assessment and Plan OT Assessment and Plan Clinical Impression Statement: A:  See progress note for current status.  Added ball circles which patient completed without difficulty.  Pt. continues to be limited mostly with ER. OT Plan: P:  Add AROM in supine except for ER continue with AAROM until increase in ROM.   Goals Short Term Goals Time to Complete Short Term Goals: 6 weeks Short Term Goal 1: Patient will be I with HEP. Short Term Goal 1 Progress: Met Short Term Goal 2: Patient will increase right shoulder PROM to [facility] for increased independence with upper body dressing. Short Term Goal 2 Progress: Met Short Term Goal 3: Patient will increase right shoulder strength to 3+/5 for increased ability to reach into overhead cabinets. Short Term Goal 3 Progress: Progressing toward goal Short Term Goal 4: Patient will decrease pain in his right shoulder to 2/10 when getting dressed. Short Term Goal 4 Progress: Met Short Term Goal 5: Patient will decrease fascial restrictions to minimal in his right shoulder region. Short Term Goal 5 Progress: Met Long Term Goals Time to Complete Long Term Goals: 12 weeks Long Term Goal 1: Patient will return to prior level of independence with daily activities, work, and leisure activities. Long Term Goal 1 Progress: Progressing toward goal Long Term Goal 2: Patient will increase his right shoulder AROM to WNL in order to reach overhead at work. Long Term Goal 2 Progress: Progressing toward goal Long Term Goal 3: Patient will increase his right shoulder strength to 5/5 for increased independence lifting boxes overhead at work. Long Term Goal 3 Progress: Progressing  toward goal Long Term Goal 4: Patient will  decrease pain in his right shoulder to 1/10 when reaching overhead. Long Term Goal 4 Progress: Progressing toward goal Long Term Goal 5: Patient will decrease fascial restrictions to trace in right shoulder. Long Term Goal 5 Progress: Progressing toward goal  Problem List Patient Active Problem List  Diagnosis  . Pain in joint, shoulder region  . Status post rotator cuff repair  . Muscle weakness (generalized)    End of Session Activity Tolerance: Patient tolerated treatment well General Behavior During Session: [facility] for tasks performed Cognition: [facility] for tasks performed  GO    [name], Dujuan Stankowski L 12/24/2012, 2:08 PM

## 2012-12-25 ENCOUNTER — Ambulatory Visit (HOSPITAL_COMMUNITY)
Admission: RE | Admit: 2012-12-25 | Discharge: 2012-12-25 | Disposition: A | Discharge status: Home / Self Care | Payer: No Typology Code available for payment source | Source: Ambulatory Visit | Attending: Orthopedic Surgery | Admitting: Orthopedic Surgery

## 2012-12-25 DIAGNOSIS — M6281 Muscle weakness (generalized): Secondary | ICD-10-CM

## 2012-12-25 DIAGNOSIS — Z9889 Other specified postprocedural states: Secondary | ICD-10-CM

## 2012-12-25 DIAGNOSIS — M25519 Pain in unspecified shoulder: Secondary | ICD-10-CM

## 2012-12-25 NOTE — Progress Notes (Signed)
Occupational Therapy Treatment Patient Details  Name: KASRA MELVIN MRN: [id number] Date of Birth: Aug 26, 1971  Today's Date: 12/25/2012 Time: 9147-8295 OT Time Calculation (min): 41 min Manual Therapy 1101-1120 19' Therapeutic Exercise 6213-0865 21'  Visit#: 14  of 36   Re-eval: 01/21/13 Assessment Diagnosis: S/P Right RCR Surgical Date: 10/08/12  Authorization:    Authorization Time Period:    Authorization Visit#:   of    Subjective Symptoms/Limitations Symptoms: S:  I'm feeling pretty good. Pain Assessment Currently in Pain?: No/denies Pain Score: 0-No pain  Precautions/Restrictions  Precautions Precautions: Shoulder (RCR - refer to protocol in scanned portion of this chart)  Exercise/Treatments Supine Protraction: PROM;AROM;10 reps Horizontal ABduction: PROM;AROM;10 reps External Rotation: PROM;AAROM;10 reps Internal Rotation: PROM;AROM;10 reps Flexion: PROM;AROM;10 reps ABduction: PROM;AROM;10 reps Other Supine Exercises: serratus anterior punch iwth 1# x 20 Seated Elevation: Other (comment) (d/c to tband) Extension: Other (comment) (d/c to tband) Row: Other (comment) (d/c to tband) Protraction: AAROM;15 reps Horizontal ABduction: AAROM;15 reps External Rotation: AAROM;15 reps Internal Rotation: AAROM;15 reps Flexion: AAROM;15 reps Abduction: AAROM;15 reps Standing Extension: Theraband;10 reps Theraband Level (Shoulder Extension): Level 3 (Green) Row: Theraband;10 reps Theraband Level (Shoulder Row): Level 3 (Green) Retraction: Theraband;10 reps Theraband Level (Shoulder Retraction): Level 3 (Green) Pulleys Flexion: 3 minutes ABduction: 3 minutes Therapy Ball Flexion: 25 reps ABduction: 25 reps Right/Left: 5 reps ROM / Strengthening / Isometric Strengthening Thumb Tacks: 2' Prot/Ret//Elev/Dep: 1'      Manual Therapy Manual Therapy: Myofascial release Myofascial Release: MFR and manual stretching to right SCM, trapezius, scapular, upper  arm, and shoulder region to decrease pain and restrictions and increase pain free mobility to [facility]  Occupational Therapy Assessment and Plan OT Assessment and Plan Clinical Impression Statement: A:  Switched from AAROM in supine to AROM except for ER which he is the most limited with.  Also increased to green tband, red band was too easy. And, added wall wash to increase his functional endurace and AROM. OT Plan: P:  Increase reps with AROM, d/c ball flexion and abd stretch contiinue with R/L ball stretch.   Goals Short Term Goals Time to Complete Short Term Goals: 6 weeks Short Term Goal 1: Patient will be I with HEP. Short Term Goal 2: Patient will increase right shoulder PROM to [facility] for increased independence with upper body dressing. Short Term Goal 3: Patient will increase right shoulder strength to 3+/5 for increased ability to reach into overhead cabinets. Short Term Goal 4: Patient will decrease pain in his right shoulder to 2/10 when getting dressed. Short Term Goal 5: Patient will decrease fascial restrictions to minimal in his right shoulder region. Long Term Goals Time to Complete Long Term Goals: 12 weeks Long Term Goal 1: Patient will return to prior level of independence with daily activities, work, and leisure activities. Long Term Goal 2: Patient will increase his right shoulder AROM to WNL in order to reach overhead at work. Long Term Goal 3: Patient will increase his right shoulder strength to 5/5 for increased independence lifting boxes overhead at work. Long Term Goal 4: Patient will decrease pain in his right shoulder to 1/10 when reaching overhead. Long Term Goal 5: Patient will decrease fascial restrictions to trace in right shoulder.  Problem List Patient Active Problem List  Diagnosis  . Pain in joint, shoulder region  . Status post rotator cuff repair  . Muscle weakness (generalized)    End of Session Activity Tolerance: Patient tolerated treatment  well General Behavior During Session: [facility]  for tasks performed Cognition: [facility] for tasks performed  GO    [name], Mauria Asquith L 12/25/2012, 11:50 AM

## 2012-12-27 ENCOUNTER — Ambulatory Visit (HOSPITAL_COMMUNITY)
Admission: RE | Admit: 2012-12-27 | Discharge: 2012-12-27 | Disposition: A | Discharge status: Home / Self Care | Payer: No Typology Code available for payment source | Source: Ambulatory Visit | Attending: Orthopedic Surgery | Admitting: Orthopedic Surgery

## 2012-12-27 DIAGNOSIS — Z9889 Other specified postprocedural states: Secondary | ICD-10-CM

## 2012-12-27 DIAGNOSIS — M6281 Muscle weakness (generalized): Secondary | ICD-10-CM

## 2012-12-27 DIAGNOSIS — M25519 Pain in unspecified shoulder: Secondary | ICD-10-CM

## 2012-12-27 NOTE — Progress Notes (Signed)
Occupational Therapy Treatment Patient Details  Name: Wayne Weber MRN: [id number] Date of Birth: 12/09/1971  Today's Date: 12/27/2012 Time: 1478-2956 OT Time Calculation (min): 43 min Manual Therapy 1022-1036 14' Therapeutic Exercises 1036-1105 29' Visit#: 15  of 36   Re-eval: 01/21/13 (01/08/13 md appointment)    Subjective S:  I was a bit sore after the exercises the other day, but not in pain.  Limitations: Phase V of protocol seated AAROM and light restist tband for horizontal abduction and flexion. Phase VI begins 12/21/12 goal to restore full ROM begin AROM  Pain Assessment Currently in Pain?: No/denies  Precautions/Restrictions   see limitations above  Exercise/Treatments Supine Protraction: PROM;10 reps;AROM;12 reps Horizontal ABduction: PROM;10 reps;AROM;12 reps External Rotation: PROM;10 reps;AROM;12 reps External Rotation Limitations: 50% AROM with vg for positioning Internal Rotation: PROM;10 reps;AROM;12 reps Flexion: PROM;10 reps;AROM;12 reps ABduction: 10 reps;PROM;AROM;12 reps Seated Protraction: AROM;10 reps Horizontal ABduction: AROM;10 reps External Rotation: AROM;10 reps;Limitations External Rotation Limitations: limited to 50% AROM Internal Rotation: AROM;10 reps Flexion: AROM;10 reps Abduction: AROM;10 reps Standing Extension: Theraband;12 reps Theraband Level (Shoulder Extension): Level 3 (Green) Row: Theraband;12 reps Theraband Level (Shoulder Row): Level 3 (Green) Retraction: Theraband;10 reps Theraband Level (Shoulder Retraction): Level 3 (Green) Pulleys Flexion:  (dc) Therapy Ball Flexion:  (dc) ABduction:  (dc) Right/Left: 5 reps (dc after today as not challenging) ROM / Strengthening / Isometric Strengthening Wall Wash: 3' Thumb Tacks: dc "W" Arms: 10 reps with good form X to V Arms: 10 reps with good form Proximal Shoulder Strengthening, Seated: 10 reps each activity without resting between exercises Ball on Wall: 1' flexion  and 1' abduction Prot/Ret//Elev/Dep: dc      Manual Therapy Manual Therapy: Myofascial release Myofascial Release: MFR and manual stretching to right SCM, trapezius, scapular, upper arm, and shoulder region to decrease pain and restrictions and increase pain free mobility to [facility]  [id number]  Occupational Therapy Assessment and Plan OT Assessment and Plan Clinical Impression Statement: A:  Added AROM in seated as well as scapular strengthening exercises for improved shoulder alignment and decreased pain. OT Plan: P:  Attempt adding a weight to wall wash and add UBE.  Add weighted ball IR exercise.   Goals Short Term Goals Time to Complete Short Term Goals: 6 weeks Short Term Goal 1: Patient will be I with HEP. Short Term Goal 2: Patient will increase right shoulder PROM to [facility] for increased independence with upper body dressing. Short Term Goal 3: Patient will increase right shoulder strength to 3+/5 for increased ability to reach into overhead cabinets. Short Term Goal 4: Patient will decrease pain in his right shoulder to 2/10 when getting dressed. Short Term Goal 5: Patient will decrease fascial restrictions to minimal in his right shoulder region. Long Term Goals Time to Complete Long Term Goals: 12 weeks Long Term Goal 1: Patient will return to prior level of independence with daily activities, work, and leisure activities. Long Term Goal 2: Patient will increase his right shoulder AROM to WNL in order to reach overhead at work. Long Term Goal 3: Patient will increase his right shoulder strength to 5/5 for increased independence lifting boxes overhead at work. Long Term Goal 4: Patient will decrease pain in his right shoulder to 1/10 when reaching overhead. Long Term Goal 5: Patient will decrease fascial restrictions to trace in right shoulder.  Problem List Patient Active Problem List  Diagnosis  . Pain in joint, shoulder region  . Status post rotator cuff repair  . Muscle  weakness (generalized)    End of Session Activity Tolerance: Patient tolerated treatment well General Behavior During Session: [facility] for tasks performed Cognition: [facility] for tasks performed  GO    Shirlean Mylar, OTR/L  12/27/2012, 11:09 AM

## 2013-01-01 ENCOUNTER — Ambulatory Visit (HOSPITAL_COMMUNITY)
Admission: RE | Admit: 2013-01-01 | Discharge: 2013-01-01 | Disposition: A | Discharge status: Home / Self Care | Payer: No Typology Code available for payment source | Source: Ambulatory Visit | Attending: Orthopedic Surgery | Admitting: Orthopedic Surgery

## 2013-01-01 DIAGNOSIS — M25519 Pain in unspecified shoulder: Secondary | ICD-10-CM

## 2013-01-01 DIAGNOSIS — M6281 Muscle weakness (generalized): Secondary | ICD-10-CM

## 2013-01-01 DIAGNOSIS — Z9889 Other specified postprocedural states: Secondary | ICD-10-CM

## 2013-01-01 NOTE — Progress Notes (Signed)
Occupational Therapy Treatment Patient Details  Name: AQEEL NORGAARD MRN: [id number] Date of Birth: [birth year]  Today's Date: 01/01/2013 Time: 7829-5621 OT Time Calculation (min): 37 min Massage 3086-5784 16' Therex 6962-9528 21'  Visit#: 16  of 36   Re-eval: 01/21/13 (01/08/13 MD appt)    Authorization:    Authorization Time Period:    Authorization Visit#:   of    Subjective Symptoms/Limitations Symptoms: S: i don't have any pain today. Pain Assessment Currently in Pain?: No/denies  Precautions/Restrictions  Precautions Precautions: Shoulder (RCR - protocol)  Exercise/Treatments Supine Protraction: PROM;10 reps;AROM;12 reps Horizontal ABduction: PROM;10 reps;AROM;12 reps External Rotation: PROM;10 reps;AROM;12 reps Internal Rotation: PROM;10 reps;AROM;12 reps Flexion: PROM;10 reps;AROM;12 reps ABduction: 10 reps;PROM;AROM;12 reps Seated Protraction: AROM;10 reps Horizontal ABduction: AROM;10 reps External Rotation: AROM;10 reps;Limitations External Rotation Limitations: limited to 50% AROM Internal Rotation: AROM;10 reps Flexion: AROM;10 reps Abduction: AROM;10 reps Standing Extension: Theraband;12 reps Theraband Level (Shoulder Extension): Level 3 (Green) Row: Theraband;12 reps Theraband Level (Shoulder Row): Level 3 (Green) Retraction: Theraband;10 reps Theraband Level (Shoulder Retraction): Level 3 (Green) ROM / Strengthening / Isometric Strengthening UBE (Upper Arm Bike): 5.0; 3' forward; 3' backwards Wall Wash: 3'      Manual Therapy Manual Therapy: Massage Massage: MFR and manual stretching to right SCM, trapezius, scapular, upper arm, and shoulder region to decrease pain and restrictions and increase pain free mobility to [facility]   Occupational Therapy Assessment and Plan OT Assessment and Plan Clinical Impression Statement: A: No new complaints. Added UBE bike and tolerated well. OT Plan: P: Attempt to add weight to wall wash. Add weighted ball  IR exercises.   Goals Short Term Goals Time to Complete Short Term Goals: 6 weeks Short Term Goal 1: Patient will be I with HEP. Short Term Goal 2: Patient will increase right shoulder PROM to [facility] for increased independence with upper body dressing. Short Term Goal 3: Patient will increase right shoulder strength to 3+/5 for increased ability to reach into overhead cabinets. Short Term Goal 3 Progress: Progressing toward goal Short Term Goal 4: Patient will decrease pain in his right shoulder to 2/10 when getting dressed. Short Term Goal 5: Patient will decrease fascial restrictions to minimal in his right shoulder region. Long Term Goals Time to Complete Long Term Goals: 12 weeks Long Term Goal 1: Patient will return to prior level of independence with daily activities, work, and leisure activities. Long Term Goal 1 Progress: Progressing toward goal Long Term Goal 2: Patient will increase his right shoulder AROM to WNL in order to reach overhead at work. Long Term Goal 2 Progress: Progressing toward goal Long Term Goal 3: Patient will increase his right shoulder strength to 5/5 for increased independence lifting boxes overhead at work. Long Term Goal 3 Progress: Progressing toward goal Long Term Goal 4: Patient will decrease pain in his right shoulder to 1/10 when reaching overhead. Long Term Goal 4 Progress: Progressing toward goal Long Term Goal 5: Patient will decrease fascial restrictions to trace in right shoulder. Long Term Goal 5 Progress: Progressing toward goal  Problem List Patient Active Problem List  Diagnosis  . Pain in joint, shoulder region  . Status post rotator cuff repair  . Muscle weakness (generalized)    End of Session Activity Tolerance: Patient tolerated treatment well General Behavior During Session: [facility] for tasks performed Cognition: [facility] for tasks performed   Limmie Patricia, OTR/L 01/01/2013, 2:38 PM

## 2013-01-03 ENCOUNTER — Ambulatory Visit (HOSPITAL_COMMUNITY)
Admission: RE | Admit: 2013-01-03 | Discharge: 2013-01-03 | Disposition: A | Discharge status: Home / Self Care | Payer: No Typology Code available for payment source | Source: Ambulatory Visit | Attending: Orthopedic Surgery | Admitting: Orthopedic Surgery

## 2013-01-03 DIAGNOSIS — M6281 Muscle weakness (generalized): Secondary | ICD-10-CM

## 2013-01-03 DIAGNOSIS — M25519 Pain in unspecified shoulder: Secondary | ICD-10-CM

## 2013-01-03 DIAGNOSIS — Z9889 Other specified postprocedural states: Secondary | ICD-10-CM

## 2013-01-03 NOTE — Progress Notes (Signed)
Occupational Therapy Treatment Patient Details  Name: Wayne Weber MRN: [id number] Date of Birth: Dec 30, 1970  Today's Date: 01/03/2013 Time: 4098-1191 OT Time Calculation (min): 43 min Manual Therapy 4782-9562 23' Therapeutic Exercises 1415-1435 20' Visit#: 17  of 36   Re-eval: 01/21/13 (01/08/13 MD appt)    Subjective S : I feel like I have a hump on my shoulder. OTR/L examined right shoulder, slight raised region at distal clavicle area discussed surgery technique could be scar tissue from DCE and or anchor from tendon repair. Limitations: Phase VI AROM to beginning strengthening transitioning into Phase VII restore full strength see scanned protocol for exact details Pain Assessment Currently in Pain?: Yes Pain Score:   1 Pain Location: Shoulder Pain Orientation: Right Pain Type: Acute pain  Precautions/Restrictions    Phase VI AROM to beginning strengthening transitioning into Phase VII restore full strength see scanned protocol for exact details  Exercise/Treatments Supine Protraction: PROM;Strengthening;10 reps Protraction Weight (lbs): 1 Horizontal ABduction: PROM;Strengthening;10 reps Horizontal ABduction Weight (lbs): 1 External Rotation: PROM;Strengthening;10 reps External Rotation Weight (lbs): 1 Internal Rotation: PROM;Strengthening;10 reps Internal Rotation Weight (lbs): 1 Flexion: PROM;Strengthening;10 reps Shoulder Flexion Weight (lbs): 1 ABduction: PROM;Strengthening;10 reps Shoulder ABduction Weight (lbs): 1 Other Supine Exercises: serratus anterior strength punch with 1# x 15 reps Seated Extension: Strengthening;10 reps Extension Weight (lbs): 1 Retraction: Strengthening;10 reps Retraction Weight (lbs): 1 Row: Strengthening;10 reps Row Weight (lbs): 1 Protraction: AROM;15 reps Horizontal ABduction: AROM;15 reps External Rotation: AROM;15 reps External Rotation Limitations: limited to 50% AROM Internal Rotation: AROM;15 reps Flexion: AROM;15  reps Abduction: AROM;15 reps ROM / Strengthening / Isometric Strengthening UBE (Upper Arm Bike): 3' and 3' 2.0 "W" Arms: 15 reps X to V Arms: 15 reps Proximal Shoulder Strengthening, Seated: 15 reps      Manual Therapy Manual Therapy: Myofascial release Massage: MFR and manual stretching to right SCM, trapezius, scapular, upper arm, and shoulder region to decrease pain and restrictions and increase pain free mobility to [facility]  Myofascial Release: MFR and manual stretching to right SCM, trapezius, scapular, upper arm, and shoulder region to decrease pain and restrictions and increase pain free mobility to [facility]   Occupational Therapy Assessment and Plan OT Assessment and Plan Clinical Impression Statement: A:  Began strengthening in supine with good form and minimal pain.  Attempted strengthening of protraction in standing, but activity was too painful. OT Plan: P: Attempt to add weight to wall wash. Add weighted ball IR exercises.  Attempt strengthening in seated.  REASSESS for MD appointment 01/08/13   Goals Short Term Goals Time to Complete Short Term Goals: 6 weeks Short Term Goal 1: Patient will be I with HEP. Short Term Goal 2: Patient will increase right shoulder PROM to [facility] for increased independence with upper body dressing. Short Term Goal 3: Patient will increase right shoulder strength to 3+/5 for increased ability to reach into overhead cabinets. Short Term Goal 4: Patient will decrease pain in his right shoulder to 2/10 when getting dressed. Short Term Goal 5: Patient will decrease fascial restrictions to minimal in his right shoulder region. Long Term Goals Time to Complete Long Term Goals: 12 weeks Long Term Goal 1: Patient will return to prior level of independence with daily activities, work, and leisure activities. Long Term Goal 2: Patient will increase his right shoulder AROM to WNL in order to reach overhead at work. Long Term Goal 3: Patient will increase his right  shoulder strength to 5/5 for increased independence lifting boxes overhead at work.  Long Term Goal 4: Patient will decrease pain in his right shoulder to 1/10 when reaching overhead. Long Term Goal 5: Patient will decrease fascial restrictions to trace in right shoulder.  Problem List Patient Active Problem List  Diagnosis  . Pain in joint, shoulder region  . Status post rotator cuff repair  . Muscle weakness (generalized)    End of Session Activity Tolerance: Patient tolerated treatment well General Behavior During Session: [facility] for tasks performed Cognition: [facility] for tasks performed  GO    Shirlean Mylar, OTR/L  01/03/2013, 4:30 PM

## 2013-01-07 ENCOUNTER — Ambulatory Visit (HOSPITAL_COMMUNITY)
Admission: RE | Admit: 2013-01-07 | Discharge: 2013-01-07 | Disposition: A | Discharge status: Home / Self Care | Payer: No Typology Code available for payment source | Source: Ambulatory Visit | Attending: Orthopedic Surgery | Admitting: Orthopedic Surgery

## 2013-01-07 ENCOUNTER — Ambulatory Visit (HOSPITAL_COMMUNITY): Payer: Self-pay

## 2013-01-07 DIAGNOSIS — M25519 Pain in unspecified shoulder: Secondary | ICD-10-CM

## 2013-01-07 DIAGNOSIS — M6281 Muscle weakness (generalized): Secondary | ICD-10-CM

## 2013-01-07 DIAGNOSIS — Z9889 Other specified postprocedural states: Secondary | ICD-10-CM

## 2013-01-07 NOTE — Progress Notes (Signed)
Occupational Therapy Treatment Patient Details  Name: MOHAMUD MROZEK MRN: [id number] Date of Birth: 19-Nov-1971  Today's Date: 01/07/2013 Time: 4098-1191 OT Time Calculation (min): 51 min Re-assess 4782-9562 10' Massage 1535-1550 15' Therex 1308-6578 26'  Visit#: 18  of 36   Re-eval: 01/21/13 (01/08/13 MD appt)    Authorization:    Authorization Time Period:    Authorization Visit#:   of    Subjective Symptoms/Limitations Symptoms: S: My biggest concerns is getting my range of motion back. My arm gets tired easily and it's hard to put my shirt on. Pain Assessment Currently in Pain?: Yes Pain Score:   2 Pain Location: Shoulder Pain Orientation: Right Pain Type: Acute pain Pain Radiating Towards: discomfort  Precautions/Restrictions  Precautions Precautions: Shoulder (RCR protocol)  Exercise/Treatments Supine Protraction: PROM;Strengthening;10 reps Protraction Weight (lbs): 1 Horizontal ABduction: PROM;Strengthening;10 reps Horizontal ABduction Weight (lbs): 1 External Rotation: PROM;Strengthening;10 reps External Rotation Weight (lbs): 1 Internal Rotation: PROM;Strengthening;10 reps Internal Rotation Weight (lbs): 1 Flexion: PROM;Strengthening;10 reps Shoulder Flexion Weight (lbs): 1 ABduction: PROM;Strengthening;10 reps Shoulder ABduction Weight (lbs): 1 Other Supine Exercises: serratus anterior strength punch with 1# x 15 reps ROM / Strengthening / Isometric Strengthening UBE (Upper Arm Bike): 3' forward 3' reverse 5.0      Manual Therapy Manual Therapy: Massage Massage: Massage and manual stretching to right SCM, trapezius, scapular, upper arm, and shoulder region to decrease pain and restrictions and increase pain free mobility to [facility]   Occupational Therapy Assessment and Plan OT Assessment and Plan Clinical Impression Statement: A: Re-assessment performed as patient is seeing the MD tomorrow morning. Please refer to MD note for progress. OT Plan: P:  Attempt to add weight to wall wash. Add weighted ball IR exercises. Attempt strengthening in seated.   Goals Short Term Goals Time to Complete Short Term Goals: 6 weeks Short Term Goal 1: Patient will be I with HEP. Short Term Goal 2: Patient will increase right shoulder PROM to [facility] for increased independence with upper body dressing. Short Term Goal 3: Patient will increase right shoulder strength to 3+/5 for increased ability to reach into overhead cabinets. Short Term Goal 3 Progress: Progressing toward goal Short Term Goal 4: Patient will decrease pain in his right shoulder to 2/10 when getting dressed. Short Term Goal 5: Patient will decrease fascial restrictions to minimal in his right shoulder region. Long Term Goals Time to Complete Long Term Goals: 12 weeks Long Term Goal 1: Patient will return to prior level of independence with daily activities, work, and leisure activities. Long Term Goal 1 Progress: Progressing toward goal Long Term Goal 2: Patient will increase his right shoulder AROM to WNL in order to reach overhead at work. Long Term Goal 2 Progress: Progressing toward goal Long Term Goal 3: Patient will increase his right shoulder strength to 5/5 for increased independence lifting boxes overhead at work. Long Term Goal 3 Progress: Other (comment) Long Term Goal 4: Patient will decrease pain in his right shoulder to 1/10 when reaching overhead. Long Term Goal 4 Progress: Progressing toward goal Long Term Goal 5: Patient will decrease fascial restrictions to trace in right shoulder. Long Term Goal 5 Progress: Progressing toward goal  Problem List Patient Active Problem List  Diagnosis  . Pain in joint, shoulder region  . Status post rotator cuff repair  . Muscle weakness (generalized)    End of Session Activity Tolerance: Patient tolerated treatment well General Behavior During Session: [facility] for tasks performed Cognition: [facility] for tasks performed  Limmie Patricia, OTR/L 01/07/2013, 4:25 PM

## 2013-01-08 ENCOUNTER — Ambulatory Visit (HOSPITAL_COMMUNITY): Payer: Self-pay

## 2013-01-10 ENCOUNTER — Ambulatory Visit (HOSPITAL_COMMUNITY): Payer: Self-pay

## 2013-01-11 ENCOUNTER — Ambulatory Visit (HOSPITAL_COMMUNITY): Payer: Self-pay | Admitting: Specialist

## 2013-01-14 ENCOUNTER — Ambulatory Visit (HOSPITAL_COMMUNITY): Payer: Self-pay

## 2013-01-15 ENCOUNTER — Inpatient Hospital Stay (HOSPITAL_COMMUNITY): Admission: RE | Admit: 2013-01-15 | Payer: Self-pay | Source: Ambulatory Visit | Admitting: Specialist

## 2013-01-15 ENCOUNTER — Ambulatory Visit (HOSPITAL_COMMUNITY): Payer: Self-pay

## 2013-01-16 ENCOUNTER — Ambulatory Visit (HOSPITAL_COMMUNITY)
Admission: RE | Admit: 2013-01-16 | Discharge: 2013-01-16 | Disposition: A | Discharge status: Home / Self Care | Payer: No Typology Code available for payment source | Source: Ambulatory Visit | Attending: Orthopedic Surgery | Admitting: Orthopedic Surgery

## 2013-01-16 DIAGNOSIS — M25619 Stiffness of unspecified shoulder, not elsewhere classified: Secondary | ICD-10-CM | POA: Insufficient documentation

## 2013-01-16 DIAGNOSIS — M25519 Pain in unspecified shoulder: Secondary | ICD-10-CM | POA: Insufficient documentation

## 2013-01-16 DIAGNOSIS — M6281 Muscle weakness (generalized): Secondary | ICD-10-CM | POA: Insufficient documentation

## 2013-01-16 DIAGNOSIS — IMO0001 Reserved for inherently not codable concepts without codable children: Secondary | ICD-10-CM | POA: Insufficient documentation

## 2013-01-16 DIAGNOSIS — Z9889 Other specified postprocedural states: Secondary | ICD-10-CM

## 2013-01-16 NOTE — Progress Notes (Signed)
Occupational Therapy Treatment Patient Details  Name: Wayne Weber MRN: [id number] Date of Birth: 1971/10/07  Today's Date: 01/16/2013 Time: 4010-2725 OT Time Calculation (min): 38 min Re-assess 852-905 13' Therex 905-930 25'  Visit#: 19  of 36   Re-eval: 01/21/13 (01/08/13 MD appt)    Authorization:    Authorization Time Period:    Authorization Visit#:   of    Subjective Symptoms/Limitations Symptoms: S: My arm feels great.  Pain Assessment Currently in Pain?: No/denies  Precautions/Restrictions  Precautions Precautions: Shoulder (RCR - refer to protocol in scanned portion of this chart)  Exercise/Treatments Supine Protraction: Strengthening;15 reps Protraction Weight (lbs): 1 Horizontal ABduction: Strengthening;15 reps Horizontal ABduction Weight (lbs): 1 External Rotation: Strengthening;15 reps External Rotation Weight (lbs): 1 Internal Rotation: Strengthening;15 reps Internal Rotation Weight (lbs): 1 Flexion: Strengthening;15 reps Shoulder Flexion Weight (lbs): 1 ABduction: Strengthening;15 reps Shoulder ABduction Weight (lbs): 1 Other Supine Exercises: serratus anterior strength punch with 1# x 15 reps Seated Protraction: Strengthening;15 reps (1#) Horizontal ABduction: Strengthening;15 reps (1#) External Rotation: Strengthening;15 reps (1#) Internal Rotation: Strengthening;15 reps (1#) Flexion: Strengthening;15 reps (1#) Abduction: Strengthening;15 reps (1#) Standing Extension: Theraband;10 reps Theraband Level (Shoulder Extension): Level 3 (Green) Row: Theraband;10 reps Theraband Level (Shoulder Row): Level 3 (Green) Retraction: Theraband;10 reps Theraband Level (Shoulder Retraction): Level 3 (Green) ROM / Strengthening / Isometric Strengthening UBE (Upper Arm Bike): 3' forward 3' reverse 5.0 Wall Wash: 3' Ball on Wall: 1' flexion and 1' abduction   Occupational Therapy Assessment and Plan OT Assessment and Plan Clinical Impression Statement:  A: Re-assessment performed as patient's right shoulder was manipulated on Monday. Please refer to MD note for progress. OT Plan: P: Attempt to add weight to wall wash. Add weighted ball IR exercises.    Goals Short Term Goals Time to Complete Short Term Goals: 6 weeks Short Term Goal 1: Patient will be I with HEP. Short Term Goal 2: Patient will increase right shoulder PROM to [facility] for increased independence with upper body dressing. Short Term Goal 3: Patient will increase right shoulder strength to 3+/5 for increased ability to reach into overhead cabinets. Short Term Goal 3 Progress: Met Short Term Goal 4: Patient will decrease pain in his right shoulder to 2/10 when getting dressed. Short Term Goal 5: Patient will decrease fascial restrictions to minimal in his right shoulder region. Long Term Goals Time to Complete Long Term Goals: 12 weeks Long Term Goal 1: Patient will return to prior level of independence with daily activities, work, and leisure activities. Long Term Goal 1 Progress: Progressing toward goal Long Term Goal 2: Patient will increase his right shoulder AROM to WNL in order to reach overhead at work. Long Term Goal 2 Progress: Met Long Term Goal 3: Patient will increase his right shoulder strength to 5/5 for increased independence lifting boxes overhead at work. Long Term Goal 3 Progress: Progressing toward goal Long Term Goal 4: Patient will decrease pain in his right shoulder to 1/10 when reaching overhead. Long Term Goal 4 Progress: Progressing toward goal Long Term Goal 5: Patient will decrease fascial restrictions to trace in right shoulder. Long Term Goal 5 Progress: Progressing toward goal  Problem List Patient Active Problem List  Diagnosis  . Pain in joint, shoulder region  . Status post rotator cuff repair  . Muscle weakness (generalized)    End of Session Activity Tolerance: Patient tolerated treatment well General Behavior During Session: [facility] for  tasks performed Cognition: [facility] for tasks performed   Limmie Patricia, OTR/L  01/16/2013, 9:31 AM

## 2013-01-17 ENCOUNTER — Ambulatory Visit (HOSPITAL_COMMUNITY): Payer: Self-pay | Admitting: Specialist

## 2013-01-18 ENCOUNTER — Telehealth (HOSPITAL_COMMUNITY): Payer: Self-pay

## 2013-01-21 ENCOUNTER — Ambulatory Visit (HOSPITAL_COMMUNITY): Payer: Self-pay

## 2013-01-22 ENCOUNTER — Ambulatory Visit (HOSPITAL_COMMUNITY): Payer: Self-pay

## 2013-01-24 ENCOUNTER — Ambulatory Visit (HOSPITAL_COMMUNITY): Payer: Self-pay

## 2013-09-03 ENCOUNTER — Ambulatory Visit (INDEPENDENT_AMBULATORY_CARE_PROVIDER_SITE_OTHER): Payer: No Typology Code available for payment source | Admitting: Family Medicine

## 2013-09-03 ENCOUNTER — Encounter: Payer: Self-pay | Admitting: Family Medicine

## 2013-09-03 VITALS — BP 124/72 | HR 90 | Temp 98.5°F | Resp 18 | Wt 203.0 lb

## 2013-09-03 DIAGNOSIS — L259 Unspecified contact dermatitis, unspecified cause: Secondary | ICD-10-CM

## 2013-09-03 HISTORY — DX: Unspecified contact dermatitis, unspecified cause: L25.9

## 2013-09-03 MED ORDER — PREDNISONE 10 MG PO TABS: ORAL_TABLET | ORAL | Status: DC

## 2013-09-03 NOTE — Patient Instructions (Signed)
Start prednisone tomorrow Take claritin once a day non-drowsy Call if no improvement

## 2013-09-03 NOTE — Assessment & Plan Note (Addendum)
Will treat as contact allergy. Depo medrol 40mg  IM given Start prednisone taper claritin during the day

## 2013-09-03 NOTE — Progress Notes (Signed)
  Subjective:    Patient ID: Wayne Weber, male    DOB: 1971/11/03, 42 y.o.   MRN: [id number]  HPI  Pt here with rash and itching for past 5 days. He used a new detergent in his clothing, since then he broke out in hives and has been itching daily. Used benadryl but made him to sleep to work. Denies fever, fatigue, joint pain,. No lesions in mouth, no sick contacts  Review of Systems - per above  GEN- denies fatigue, fever, weight loss,weakness, recent illness CVS- denies chest pain, palpitations RESP- denies SOB, cough, wheeze MSK- denies joint pain, muscle aches, injury Neuro- denies headache, dizziness, syncope, seizure activity       Objective:   Physical Exam GEN- NAD, alert and oriented x3 HEENT- MMM, oropharynx clearB Skin- generalized erythematous urticarial appearing lesions on back, abdomen, arms, few on legs,no psutules, no vesicles EXT- No edema         Assessment & Plan:

## 2013-09-04 ENCOUNTER — Ambulatory Visit: Payer: Self-pay | Admitting: Family Medicine

## 2013-09-04 MED ORDER — METHYLPREDNISOLONE ACETATE 40 MG/ML IJ SUSP: 40.0000 mg | Freq: Once | INTRAMUSCULAR | Status: DC

## 2013-09-04 NOTE — Addendum Note (Signed)
Addended by: Elvina Mattes T on: 09/04/2013 09:25 AM   Modules accepted: Orders

## 2015-12-11 ENCOUNTER — Ambulatory Visit (INDEPENDENT_AMBULATORY_CARE_PROVIDER_SITE_OTHER): Payer: BLUE CROSS/BLUE SHIELD | Admitting: Family Medicine

## 2015-12-11 ENCOUNTER — Encounter: Payer: Self-pay | Admitting: Family Medicine

## 2015-12-11 VITALS — BP 142/78 | HR 82 | Temp 98.2°F | Resp 16 | Ht 73.0 in | Wt 197.0 lb

## 2015-12-11 DIAGNOSIS — K921 Melena: Secondary | ICD-10-CM

## 2015-12-11 DIAGNOSIS — Z125 Encounter for screening for malignant neoplasm of prostate: Secondary | ICD-10-CM | POA: Diagnosis not present

## 2015-12-11 LAB — COMPREHENSIVE METABOLIC PANEL
ALBUMIN: 4 g/dL (ref 3.6–5.1)
ALK PHOS: 65 U/L (ref 40–115)
ALT: 45 U/L (ref 9–46)
AST: 30 U/L (ref 10–40)
BILIRUBIN TOTAL: 0.6 mg/dL (ref 0.2–1.2)
BUN: 13 mg/dL (ref 7–25)
CO2: 25 mmol/L (ref 20–31)
CREATININE: 1.15 mg/dL (ref 0.60–1.35)
Calcium: 9 mg/dL (ref 8.6–10.3)
Chloride: 102 mmol/L (ref 98–110)
Glucose, Bld: 82 mg/dL (ref 70–99)
Potassium: 4.2 mmol/L (ref 3.5–5.3)
SODIUM: 137 mmol/L (ref 135–146)
TOTAL PROTEIN: 6.7 g/dL (ref 6.1–8.1)

## 2015-12-11 NOTE — Patient Instructions (Signed)
Referral to GI for colonoscopy Monitor your blood pressure We will call with lab results F/U pending results

## 2015-12-11 NOTE — Progress Notes (Signed)
Patient ID: Wayne Weber, male   DOB: 12/08/1971, 44 y.o.   MRN: [id number]   Subjective:    Patient ID: Wayne Weber, male    DOB: 05/28/1971, 44 y.o.   MRN: [id number]  Patient presents for Blood in Stool  patient here with blood in his stool which is been intermittent for the past year. He denies straining with bowel movement states that he is fairly regular and does not have any pain when he notices the blood in his stool. He does not have any family history of colon cancer. Denies any abdominal pain occasionally gets heartburn if he eats too many greasy or spicy foods. The past month though he's noticed some increased blood in the stool. He denies any black tarry stool.    Review Of Systems:  GEN- denies fatigue, fever, weight loss,weakness, recent illness HEENT- denies eye drainage, change in vision, nasal discharge, CVS- denies chest pain, palpitations RESP- denies SOB, cough, wheeze ABD- denies N/V, change in stools, abd pain GU- denies dysuria, hematuria, dribbling, incontinence MSK- denies joint pain, muscle aches, injury Neuro- denies headache, dizziness, syncope, seizure activity       Objective:    BP 142/78 mmHg  Pulse 82  Temp(Src) 98.2 F (36.8 C) (Oral)  Resp 16  Ht 6\' 1"  (1.854 m)  Wt 197 lb (89.359 kg)  BMI 26.00 kg/m2 GEN- NAD, alert and oriented x3 HEENT- PERRL, EOMI, non injected sclera, pink conjunctiva, MMM, oropharynx clear CVS- RRR, no murmur RESP-CTAB ABD-NABS,soft,NT,ND Rectum- normal tone, no external tags or hemorrhoids no fissure, fecal occult blood test negative soft stool in vault. No prostate enlargement or nodules Pulses- Radial,2+        Assessment & Plan:      Problem List Items Addressed This Visit    None    Visit Diagnoses    Blood in stool    -  Primary    Unclear cause of intermittent blood in stool is possible he has internal hemorrhoids. Other possibility will be polyps. I'm going to get him set up to have a  colonoscopy. I am been a check a CBC to make sure he does not have any evidence of microcytic anemia. Since his bowels are regular I advised fiber in the diet and not start any stool regimen.     Relevant Orders    CBC with Differential/Platelet    Comprehensive metabolic panel    Prostate cancer screening        Relevant Orders    PSA       Note: This dictation was prepared with Dragon dictation along with smaller phrase technology. Any transcriptional errors that result from this process are unintentional.

## 2015-12-12 LAB — CBC WITH DIFFERENTIAL/PLATELET
BASOS ABS: 0 10*3/uL (ref 0.0–0.1)
BASOS PCT: 0 % (ref 0–1)
Eosinophils Absolute: 0.3 10*3/uL (ref 0.0–0.7)
Eosinophils Relative: 4 % (ref 0–5)
HCT: 48.2 % (ref 39.0–52.0)
HEMOGLOBIN: 16.7 g/dL (ref 13.0–17.0)
Lymphocytes Relative: 34 % (ref 12–46)
Lymphs Abs: 2.4 10*3/uL (ref 0.7–4.0)
MCH: 30.7 pg (ref 26.0–34.0)
MCHC: 34.6 g/dL (ref 30.0–36.0)
MCV: 88.6 fL (ref 78.0–100.0)
MONOS PCT: 12 % (ref 3–12)
MPV: 10.3 fL (ref 8.6–12.4)
Monocytes Absolute: 0.8 10*3/uL (ref 0.1–1.0)
NEUTROS ABS: 3.5 10*3/uL (ref 1.7–7.7)
NEUTROS PCT: 50 % (ref 43–77)
Platelets: 232 10*3/uL (ref 150–400)
RBC: 5.44 MIL/uL (ref 4.22–5.81)
RDW: 15 % (ref 11.5–15.5)
WBC: 7 10*3/uL (ref 4.0–10.5)

## 2015-12-12 LAB — PSA: PSA: 0.76 ng/mL (ref <=4.00)

## 2015-12-18 ENCOUNTER — Encounter (INDEPENDENT_AMBULATORY_CARE_PROVIDER_SITE_OTHER): Payer: Self-pay | Admitting: *Deleted

## 2016-01-05 ENCOUNTER — Ambulatory Visit (INDEPENDENT_AMBULATORY_CARE_PROVIDER_SITE_OTHER): Payer: Self-pay | Admitting: Internal Medicine

## 2016-01-07 ENCOUNTER — Encounter (INDEPENDENT_AMBULATORY_CARE_PROVIDER_SITE_OTHER): Payer: Self-pay | Admitting: Internal Medicine

## 2016-01-07 ENCOUNTER — Other Ambulatory Visit (INDEPENDENT_AMBULATORY_CARE_PROVIDER_SITE_OTHER): Payer: Self-pay | Admitting: Internal Medicine

## 2016-01-07 ENCOUNTER — Ambulatory Visit (INDEPENDENT_AMBULATORY_CARE_PROVIDER_SITE_OTHER): Payer: BLUE CROSS/BLUE SHIELD | Admitting: Internal Medicine

## 2016-01-07 VITALS — BP 102/70 | HR 68 | Temp 97.5°F | Ht 73.0 in | Wt 195.1 lb

## 2016-01-07 DIAGNOSIS — K625 Hemorrhage of anus and rectum: Secondary | ICD-10-CM

## 2016-01-07 NOTE — Patient Instructions (Signed)
The risks and benefits such as perforation, bleeding, and infection were reviewed with the patient and is agreeable. 
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
## 2016-06-23 ENCOUNTER — Encounter (HOSPITAL_COMMUNITY): Payer: Self-pay | Admitting: *Deleted

## 2016-06-23 ENCOUNTER — Emergency Department (HOSPITAL_COMMUNITY): Payer: BLUE CROSS/BLUE SHIELD

## 2016-06-23 ENCOUNTER — Emergency Department (HOSPITAL_COMMUNITY)
Admission: EM | Admit: 2016-06-23 | Discharge: 2016-06-23 | Disposition: A | Discharge status: Home / Self Care | Payer: BLUE CROSS/BLUE SHIELD | Attending: Emergency Medicine | Admitting: Emergency Medicine

## 2016-06-23 DIAGNOSIS — F17200 Nicotine dependence, unspecified, uncomplicated: Secondary | ICD-10-CM | POA: Diagnosis not present

## 2016-06-23 DIAGNOSIS — Y999 Unspecified external cause status: Secondary | ICD-10-CM | POA: Diagnosis not present

## 2016-06-23 DIAGNOSIS — M542 Cervicalgia: Secondary | ICD-10-CM | POA: Diagnosis present

## 2016-06-23 DIAGNOSIS — S161XXA Strain of muscle, fascia and tendon at neck level, initial encounter: Secondary | ICD-10-CM | POA: Insufficient documentation

## 2016-06-23 DIAGNOSIS — Y92410 Unspecified street and highway as the place of occurrence of the external cause: Secondary | ICD-10-CM | POA: Insufficient documentation

## 2016-06-23 DIAGNOSIS — M546 Pain in thoracic spine: Secondary | ICD-10-CM | POA: Insufficient documentation

## 2016-06-23 DIAGNOSIS — Y939 Activity, unspecified: Secondary | ICD-10-CM | POA: Insufficient documentation

## 2016-06-23 MED ORDER — IBUPROFEN 600 MG PO TABS: 600.0000 mg | ORAL_TABLET | Freq: Four times a day (QID) | ORAL | Status: DC | PRN

## 2016-06-23 MED ORDER — METHOCARBAMOL 500 MG PO TABS: 500.0000 mg | ORAL_TABLET | Freq: Four times a day (QID) | ORAL | Status: AC
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
## 2018-07-10 ENCOUNTER — Other Ambulatory Visit: Payer: Self-pay

## 2018-07-10 ENCOUNTER — Ambulatory Visit (INDEPENDENT_AMBULATORY_CARE_PROVIDER_SITE_OTHER): Payer: BLUE CROSS/BLUE SHIELD | Admitting: Family Medicine

## 2018-07-10 ENCOUNTER — Encounter: Payer: Self-pay | Admitting: Family Medicine

## 2018-07-10 VITALS — BP 138/88 | HR 62 | Temp 98.1°F | Resp 16 | Ht 73.5 in | Wt 194.0 lb

## 2018-07-10 DIAGNOSIS — Z0000 Encounter for general adult medical examination without abnormal findings: Secondary | ICD-10-CM | POA: Diagnosis not present

## 2018-07-10 DIAGNOSIS — Z789 Other specified health status: Secondary | ICD-10-CM

## 2018-07-10 DIAGNOSIS — Z13220 Encounter for screening for lipoid disorders: Secondary | ICD-10-CM

## 2018-07-10 DIAGNOSIS — H6123 Impacted cerumen, bilateral: Secondary | ICD-10-CM

## 2018-07-10 DIAGNOSIS — Z125 Encounter for screening for malignant neoplasm of prostate: Secondary | ICD-10-CM | POA: Diagnosis not present

## 2018-07-10 DIAGNOSIS — L659 Nonscarring hair loss, unspecified: Secondary | ICD-10-CM

## 2018-07-10 DIAGNOSIS — Z114 Encounter for screening for human immunodeficiency virus [HIV]: Secondary | ICD-10-CM | POA: Diagnosis not present

## 2018-07-10 DIAGNOSIS — R21 Rash and other nonspecific skin eruption: Secondary | ICD-10-CM

## 2018-07-10 DIAGNOSIS — Z7289 Other problems related to lifestyle: Secondary | ICD-10-CM

## 2018-07-10 DIAGNOSIS — E785 Hyperlipidemia, unspecified: Secondary | ICD-10-CM | POA: Insufficient documentation

## 2018-07-10 MED ORDER — TRIAMCINOLONE ACETONIDE 0.025 % EX OINT: 1.0000 "application " | TOPICAL_OINTMENT | Freq: Two times a day (BID) | CUTANEOUS | 0 refills | Status: DC

## 2018-07-10 NOTE — Progress Notes (Signed)
Patient: Wayne Weber, Male    DOB: 1970-12-21, 47 y.o.   MRN: [id number] Visit Date: 07/10/2018  Today's Provider: Danelle Berry, PA-C   Chief Complaint  Patient presents with  . Annual Exam   Subjective:    Annual physical exam Wayne Weber is a 47 y.o. male who presents today for health maintenance and complete physical. He feels well. He reports exercising - not currently, but he wants to get back to working out at [employer]. He reports he is sleeping well.  -----------------------------------------------------------------  Patient was last seen at this clinic December 2016, at that time and had blood in his stool.  He states that those symptoms resolved after decreasing alcohol intake and adjusting his foods which she states were prior spicy.  He did not do any stool testing or see GI or get a colonoscopy.  He has not had any repeated or recent blood red blood per rectum, melena or hematochezia.  Patient has started working at [employer], he brings with him employee medical premium discount application and verification for biometric screening and lab work.  He recently started working for this company full-time, does shift work and third shift.  His mother recently moved into a nursing home and he has been spending most of his extra time helping to arrange her care and take care of her.  This is a source of increased stress to him.  He states that he has not spent much time on exercising or healthy diet for himself because of this.  He also has increased drinking of beer as a coping mechanism, endorses drinking at least 6 cans of beer a day to unwind.  He feels that once he gets his wellness paperwork submitted for his job and that we will give him a discount of the [employer] he intends to use healthier outlets for his stress such as going and exercising.  He has been able to skip drinking completely in the past without any symptoms of withdrawal or feeling ill.    When asked if he  has any concerns today he states that he had a patchy rash on his left inner foot for some time, he has had some hair loss in his mustache and he does not know why, and also has had a history of cerumen impaction to his ears any does feel that he may have some wax in his ear but he denies any pain, change to hearing or vertigo  Family history, social history, surgical history reviewed and updated as documented below.  Review of Systems  Constitutional: Negative.  Negative for activity change, appetite change, fatigue and unexpected weight change.  HENT: Negative.   Eyes: Negative.   Respiratory: Negative.  Negative for shortness of breath.   Cardiovascular: Negative.  Negative for chest pain, palpitations and leg swelling.  Gastrointestinal: Negative.  Negative for abdominal pain, anal bleeding, blood in stool, constipation, diarrhea, nausea and vomiting.  Endocrine: Negative.   Genitourinary: Negative.  Negative for decreased urine volume, difficulty urinating, testicular pain and urgency.  Musculoskeletal: Negative.   Skin: Positive for rash. Negative for color change, pallor and wound.  Allergic/Immunologic: Negative.   Neurological: Negative.  Negative for syncope, weakness, light-headedness and numbness.  Hematological: Negative.   Psychiatric/Behavioral: Negative.  Negative for confusion, dysphoric mood, self-injury and suicidal ideas. The patient is not nervous/anxious.   All other systems reviewed and are negative.   Social History      He  reports that he quit  smoking about 3 years ago. He has never used smokeless tobacco. He reports that he drinks about 3.6 oz of alcohol per week. He reports that he does not use drugs.       Social History   Socioeconomic History  . Marital status: Single    Spouse name: Not on file  . Number of children: Not on file  . Years of education: Not on file  . Highest education level: Not on file  Occupational History  . Not on file  Social  Needs  . Financial resource strain: Not on file  . Food insecurity:    Worry: Not on file    Inability: Not on file  . Transportation needs:    Medical: Not on file    Non-medical: Not on file  Tobacco Use  . Smoking status: Former Smoker    Last attempt to quit: 07/11/2015    Years since quitting: 3.0  . Smokeless tobacco: Never Used  . Tobacco comment: quit smoking 12/2015. Smoked off an on for 20 yrs.   Substance and Sexual Activity  . Alcohol use: Yes    Alcohol/week: 3.6 oz    Types: 6 Cans of beer per week  . Drug use: No  . Sexual activity: Yes    Comment: multiple partners, not regularly using condoms  Lifestyle  . Physical activity:    Days per week: 0 days    Minutes per session: Not on file  . Stress: Not on file  Relationships  . Social connections:    Talks on phone: Not on file    Gets together: Not on file    Attends religious service: Not on file    Active member of club or organization: Not on file    Attends meetings of clubs or organizations: Not on file    Relationship status: Not on file  Other Topics Concern  . Not on file  Social History Narrative  . Not on file    Past Medical History:  Diagnosis Date  . Rectal bleeding      Patient Active Problem List   Diagnosis Date Noted  . Rectal bleeding 01/07/2016  . Contact dermatitis 09/03/2013  . Pain in joint, shoulder region 11/23/2012  . Status post rotator cuff repair 11/23/2012  . Muscle weakness (generalized) 11/23/2012    Past Surgical History:  Procedure Laterality Date  . ACHILLES TENDON REPAIR    . ROTATOR CUFF REPAIR    . TONSILLECTOMY      Family History        Family Status  Relation Name Status  . Mother  Alive  . Father  Deceased       Mi at age 60   . Brother 1 Alive       good health, DM, Diabetic  . Other single Alive  . Child 2 Alive       good health        His family history includes Diabetes in his father and mother; Heart disease in his father;  Hypertension in his mother; Thyroid disease in his mother.      No Known Allergies   Current Outpatient Medications:  .  ibuprofen (ADVIL,MOTRIN) 600 MG tablet, Take 1 tablet (600 mg total) by mouth every 6 (six) hours as needed., Disp: 30 tablet, Rfl: 0 .  triamcinolone (KENALOG) 0.025 % ointment, Apply 1 application topically 2 (two) times daily., Disp: 30 g, Rfl: 0   Patient Care Team: Salley Scarlet, MD as  PCP - General (Family Medicine)      Objective:   Vitals: BP 138/88   Pulse 62   Temp 98.1 F (36.7 C) (Oral)   Resp 16   Ht 6' 1.5" (1.867 m)   Wt 194 lb (88 kg)   SpO2 98%   BMI 25.25 kg/m    Vitals:   07/10/18 0819  BP: 138/88  Pulse: 62  Resp: 16  Temp: 98.1 F (36.7 C)  TempSrc: Oral  SpO2: 98%  Weight: 194 lb (88 kg)  Height: 6' 1.5" (1.867 m)     Physical Exam  Constitutional: He is oriented to person, place, and time. He appears well-developed and well-nourished.  Non-toxic appearance. He does not appear ill. No distress.  HENT:  Head: Normocephalic and atraumatic.  Right Ear: Tympanic membrane, external ear and ear canal normal.  Left Ear: Tympanic membrane, external ear and ear canal normal.  Nose: Nose normal. No mucosal edema or rhinorrhea. Right sinus exhibits no maxillary sinus tenderness and no frontal sinus tenderness. Left sinus exhibits no maxillary sinus tenderness and no frontal sinus tenderness.  Mouth/Throat: Uvula is midline and oropharynx is clear and moist. No trismus in the jaw. No uvula swelling. No oropharyngeal exudate, posterior oropharyngeal edema or posterior oropharyngeal erythema.  Eyes: Pupils are equal, round, and reactive to light. Conjunctivae, EOM and lids are normal. No scleral icterus.  Neck: Trachea normal, normal range of motion and phonation normal. Neck supple. No tracheal deviation present. No thyromegaly present.  Cardiovascular: Regular rhythm, normal heart sounds, intact distal pulses and normal pulses. Exam  reveals no gallop and no friction rub.  No murmur heard. Pulses:      Radial pulses are 2+ on the right side, and 2+ on the left side.       Posterior tibial pulses are 2+ on the right side, and 2+ on the left side.  Pulmonary/Chest: Effort normal and breath sounds normal. No stridor. No respiratory distress. He has no wheezes. He has no rhonchi. He has no rales.  Abdominal: Soft. Normal appearance and bowel sounds are normal. He exhibits no distension and no mass. There is no tenderness. There is no rebound and no guarding.  Musculoskeletal: Normal range of motion. He exhibits no edema.  Lymphadenopathy:    He has no cervical adenopathy.  Neurological: He is alert and oriented to person, place, and time. He exhibits normal muscle tone. Coordination and gait normal.  Skin: Skin is warm, dry and intact. Capillary refill takes less than 2 seconds. No rash noted. He is not diaphoretic. No erythema.  Patchy hair loss to mustach, w/o any rash, erythema or flaking 5x4 cm area of dried fleshcolored patch to left inner foot  Psychiatric: He has a normal mood and affect. His speech is normal and behavior is normal.  Nursing note and vitals reviewed.    Depression Screen PHQ 2/9 Scores 07/10/2018  PHQ - 2 Score 0     [facility]  Alcohol Use Disorder Identification Test Final Score (AUDIT)  12        Assessment & Plan:     Routine Health Maintenance and Physical Exam  Exercise Activities and Dietary recommendations Goals    . Exercise 3x per week (30 min per time)     Going to join the [facility] again and start going       There is no immunization history for the selected administration types on file for this patient.  Health Maintenance  Topic Date Due  . HIV Screening  09/15/1985  . TETANUS/TDAP  09/15/1989  . INFLUENZA VACCINE  07/12/2018   Patient is due for Tdap but did not want to do today, he will get done on Friday when he  returns for recheck of his ears.  Discussed health benefits of physical activity, and encouraged him to engage in regular exercise appropriate for his age and condition.    --------------------------------------------------------------------    Problem List Items Addressed This Visit    None    Visit Diagnoses    Annual visit for general adult medical examination without abnormal findings    -  Primary   Relevant Orders   CBC with Differential/Platelet   COMPLETE METABOLIC PANEL WITH GFR   Lipid panel   Prostate cancer screening    - no LUTS, screening done in the past, will recheck, discussed +/- of screening, pt agrees   Relevant Orders   PSA   Screening for hyperlipidemia       Relevant Orders   Lipid panel   Screening for HIV (human immunodeficiency virus)       Relevant Orders   HIV antibody   Hair loss    - patchy hair loss to facial hair w/o any associated rash, will check testosterone   Relevant Orders   Testosterone   Bilateral impacted cerumen       debrox drops BID b/l ears x several days and return for irrigation   Alcohol use     Counseled and educated re: alcohol use and dependence, withdrawal/DT.  Audit score done - 11.  Pt believes he is using it to cope with his mothers health decline and he will start working out as a healthier outlet for his stress.  Advised to contact us if having any sx of withdrawal.  Pt declined any medical treatment at this time.        Rash and nonspecific skin eruption       Relevant Medications   triamcinolone (KENALOG) 0.025 % ointment     Pt has high risk heterosexual behavior with multiple partners w/o consistent condom use, advised STD testing, but pt declined and stated he was asymptomatic all urinary and genital sx.  Fasting labs obtained, work form left with me and labeled with pt name, pending lab results will be completed and pt to pick up Friday at return appointment.  Danelle Berry, PA-C 07/10/18 10:10 AM  [facility]

## 2018-07-10 NOTE — Patient Instructions (Addendum)
Get and use Debrox drops two times a day for several days in a row and return for [place] to clean out ears.  We can do this Friday morning if you are available.  Health Maintenance, Male A healthy lifestyle and preventive care is important for your health and wellness. Ask your health care provider about what schedule of regular examinations is right for you. What should I know about weight and diet? Eat a Healthy Diet  Eat plenty of vegetables, fruits, whole grains, low-fat dairy products, and lean protein.  Do not eat a lot of foods high in solid fats, added sugars, or salt.  Maintain a Healthy Weight Regular exercise can help you achieve or maintain a healthy weight. You should:  Do at least 150 minutes of exercise each week. The exercise should increase your heart rate and make you sweat (moderate-intensity exercise).  Do strength-training exercises at least twice a week.  Watch Your Levels of Cholesterol and Blood Lipids  Have your blood tested for lipids and cholesterol every 5 years starting at 48 years of age. If you are at high risk for heart disease, you should start having your blood tested when you are 48 years old. You may need to have your cholesterol levels checked more often if: ? Your lipid or cholesterol levels are high. ? You are older than 48 years of age. ? You are at high risk for heart disease.  What should I know about cancer screening? Many types of cancers can be detected early and may often be prevented. Lung Cancer  You should be screened every year for lung cancer if: ? You are a current smoker who has smoked for at least 30 years. ? You are a former smoker who has quit within the past 15 years.  Talk to your health care provider about your screening options, when you should start screening, and how often you should be screened.  Colorectal Cancer  Routine colorectal cancer screening usually begins at 48 years of age and should be repeated every 5-10  years until you are 48 years old. You may need to be screened more often if early forms of precancerous polyps or small growths are found. Your health care provider may recommend screening at an earlier age if you have risk factors for colon cancer.  Your health care provider may recommend using home test kits to check for hidden blood in the stool.  A small camera at the end of a tube can be used to examine your colon (sigmoidoscopy or colonoscopy). This checks for the earliest forms of colorectal cancer.  Prostate and Testicular Cancer  Depending on your age and overall health, your health care provider may do certain tests to screen for prostate and testicular cancer.  Talk to your health care provider about any symptoms or concerns you have about testicular or prostate cancer.  Skin Cancer  Check your skin from head to toe regularly.  Tell your health care provider about any new moles or changes in moles, especially if: ? There is a change in a mole's size, shape, or color. ? You have a mole that is larger than a pencil eraser.  Always use sunscreen. Apply sunscreen liberally and repeat throughout the day.  Protect yourself by wearing long sleeves, pants, a wide-brimmed hat, and sunglasses when outside.  What should I know about heart disease, diabetes, and high blood pressure?  If you are 44-56 years of age, have your blood pressure checked every 3-5 years.  If you are 36 years of age or older, have your blood pressure checked every year. You should have your blood pressure measured twice-once when you are at a hospital or clinic, and once when you are not at a hospital or clinic. Record the average of the two measurements. To check your blood pressure when you are not at a hospital or clinic, you can use: ? An automated blood pressure machine at a pharmacy. ? A home blood pressure monitor.  Talk to your health care provider about your target blood pressure.  If you are between  62-88 years old, ask your health care provider if you should take aspirin to prevent heart disease.  Have regular diabetes screenings by checking your fasting blood sugar level. ? If you are at a normal weight and have a low risk for diabetes, have this test once every three years after the age of 59. ? If you are overweight and have a high risk for diabetes, consider being tested at a younger age or more often.  A one-time screening for abdominal aortic aneurysm (AAA) by ultrasound is recommended for men aged 65-75 years who are current or former smokers. What should I know about preventing infection? Hepatitis B If you have a higher risk for hepatitis B, you should be screened for this virus. Talk with your health care provider to find out if you are at risk for hepatitis B infection. Hepatitis C Blood testing is recommended for:  Everyone born from [birth year] through 1966.  Anyone with known risk factors for hepatitis C.  Sexually Transmitted Diseases (STDs)  You should be screened each year for STDs including gonorrhea and chlamydia if: ? You are sexually active and are younger than 47 years of age. ? You are older than 48 years of age and your health care provider tells you that you are at risk for this type of infection. ? Your sexual activity has changed since you were last screened and you are at an increased risk for chlamydia or gonorrhea. Ask your health care provider if you are at risk.  Talk with your health care provider about whether you are at high risk of being infected with HIV. Your health care provider may recommend a prescription medicine to help prevent HIV infection.  What else can I do?  Schedule regular health, dental, and eye exams.  Stay current with your vaccines (immunizations).  Do not use any tobacco products, such as cigarettes, chewing tobacco, and e-cigarettes. If you need help quitting, ask your health care provider.  Limit alcohol intake to no more than  2 drinks per day. One drink equals 12 ounces of beer, 5 ounces of wine, or 1 ounces of hard liquor.  Do not use street drugs.  Do not share needles.  Ask your health care provider for help if you need support or information about quitting drugs.  Tell your health care provider if you often feel depressed.  Tell your health care provider if you have ever been abused or do not feel safe at home. This information is not intended to replace advice given to you by your health care provider. Make sure you discuss any questions you have with your health care provider. Document Released: 05/26/2008 Document Revised: 07/27/2016 Document Reviewed: 09/01/2015 Elsevier Interactive Patient Education  2018 [employer].    Alcohol Use Education Information about Your Drinking Your score on the Alcohol Use Disorders Identification Test was: AUDIT C:  11 TOTAL AUDIT SCORE:  12.  This score places you in the category of:  Score 0 = Abstainers Score 8-19 = Unhealthy/High Risk Drinkers  Score 1-7 = Low Risk Drinkers Score 20+ = Probable Alcohol Dependence   High Scores (20+) on the Alcohol Use Identification Test Consider becoming involved in a structured program.  You should stop drinking if: . You have tried to cut down before but have not been successful, or  . You suffer from morning shakes during a heavy drinking period, or . You have high blood pressure, or . You are pregnant, or . You have liver disease, or . You are taking medicines that react with alcohol, or . Your alcohol use is affecting your social relationships, or . You have legal consequences like DUIs, or . You call in sick to work, or . You cannot take care of our children, or . Someone close to you says you drink too much    How Much Alcohol is a Drink: Beer: 12 oz. = 1 drink 16 oz. = 1.3 drinks 22 oz. = 2 drinks 40 oz. = 3.3 drinks  Wine: 5 oz. = 1 drink 740 mL (25 oz.) bottle = 5 drinks Malt Liquor: 12 oz. =  1.5 drinks 16 oz. = 2 drinks 22 oz. = 2.5 drinks 40 oz. = 4.5 drinks  80-Proof Spirits - Hard Liquor: 1 shot = 1 drink 1 mixed drink = number of shots Can equal 1-3 drinks   What is Low-risk Drinking? . Have no more than 2 drinks of alcohol per day . Drink no more than 5 days per week . Do not drink alcohol drink alcohol when: - You drive or operate machinery - You are pregnant or breast feeding - You are taking medications that interact with alcohol - You have medical conditions made worse with alcohol - You can stop or control your drinking      Identify Your Triggers for Drinking . Parties . Particular People . Feeling lonely . Feeling tense . Family problems . Feeling sad . Feeling happy . Feeling bored . After work . Problems sleeping . Criticism . Feelings of failure . After being paid . When others are drinking . In bars . When out for dinner . After arguments . Weekends . Feeling restless . Being in pain   Effects of High-Risk Drinking To the Brain: . Aggressive, irrational behavior . Arguments, violence . Depression, nervousness . Alcohol dependence, memory loss To the Nervous System: . Trembling hands, tingling fingers . Numbness, painful nerves . Impaired sensation leading to falls . Numb tingling toes To Your Lifestyle: . Social, legal, medical problems . Domestic trouble/relationship loss . Job loss & financial problems . Shortened life span . Accidents and death from drunk driving   To the Face: . Premature aging, drinker's nose . Cancer of the throat & mouth To the Body: . Frequent cold . Reduced resistance to infection . Increased risk of pneumonia . Weakness of heart muscle . Heart failure, anemia . Impaired blood clotting . Breast cancer . Vitamin deficiency, bleeding . Severe Inflammation of the stomach . Vomiting, diarrhea, malnutrition . Ulcer, inflammation of the pancreas . Impaired sexual performance . Birth defects,  including deformities, retardation, and low birthweight   Ways to Cope Without Drinking . Go home if you tend to drink after work . Find another activity . Switch to nonalcoholic beverages . Change friends . Join a club . Volunteer . Visit relatives . Plan/take a trip . Go for a walk .  Take up a hobby . Listen to music . Talk to a friend . Reading . What would you do if you had no worries about failing?         Good Reasons for Drinking Less . I will live longer - probably 8-10 years. . I will sleep better. . I will be happier. . I will save a lot of money . My relationships will improve. . I will stay younger for longer. . I will achieve more in my life . There will be a greater chance that I will survive to a healthy old age with no premature damage to my brain.  . I will be better at my job. . I will be less likely to feel depressed and commit suicide (6 times less likely). . I will be less likely to die of heart disease or cancer. . Other people will respect me . I will be less likely to get into trouble with the police. . The possibility that I will die of liver disease will be dramatically reduced (12 times less likely). . It will be less likely that I will die in a car accident (3 times less likely).   Strategies for Cutting Down Keep Track.  Find a way to keep track of how much you drink.  If you make a note of each drink before you drink it, this will help slow you down. Count and Measure.  Know the standard drink sizes.  Ask the bartender or server about the amount of alcohol in a mixed drink. Set Goals.  Decide how many days a week you will drink and how many drinks each day. Pace and Space.  When you do drink, pace yourself.  Have no more than one drink with alcohol per hour.  Alternate "drink spacers" non-alcoholic drinks such as water, soda, or juice with drinks containing alcohol. Include Food.  Don't drink on an empty stomach.  Have some food so the  alcohol will be absorbed more slowly into your system.  Avoid Triggers.  Avoid people, places, or activities that have led to drinking in the past.  Certain times of day or feelings may also be triggers.  Make a plan so you will know what you can do instead of drinking. Plan to Handle Urges.  When an urge hits, consider these options:  Remind yourself of your reasons for changing.  Or talk it through with someone you trust. Or get involved with a healthy, distracting activity.  Or, "urge surf" - instead of fighting the feeling, accept it and ride it out, knowing it will soon crest like a wave and pass. Know Your "No".  Have a polite, convincing "no thanks" for those times when you may be offered a drink and don't want one.  The faster you can say no to these offers, the less likely you are to give in.  If you hesitate, it allows you time to think of excuses to go along.

## 2018-07-11 ENCOUNTER — Encounter: Payer: Self-pay | Admitting: Family Medicine

## 2018-07-11 LAB — LIPID PANEL
CHOLESTEROL: 259 mg/dL — AB (ref <200)
HDL: 63 mg/dL (ref >40)
LDL Cholesterol (Calc): 164 mg/dL (calc) — ABNORMAL HIGH
Non-HDL Cholesterol (Calc): 196 mg/dL (calc) — ABNORMAL HIGH (ref <130)
Total CHOL/HDL Ratio: 4.1 (calc) (ref <5.0)
Triglycerides: 170 mg/dL — ABNORMAL HIGH (ref <150)

## 2018-07-11 LAB — CBC WITH DIFFERENTIAL/PLATELET
BASOS ABS: 50 {cells}/uL (ref 0–200)
Basophils Relative: 0.8 %
Eosinophils Absolute: 310 cells/uL (ref 15–500)
Eosinophils Relative: 5 %
HEMATOCRIT: 50.6 % — AB (ref 38.5–50.0)
Hemoglobin: 17.3 g/dL — ABNORMAL HIGH (ref 13.2–17.1)
LYMPHS ABS: 2176 {cells}/uL (ref 850–3900)
MCH: 30.8 pg (ref 27.0–33.0)
MCHC: 34.2 g/dL (ref 32.0–36.0)
MCV: 90.2 fL (ref 80.0–100.0)
MONOS PCT: 10.3 %
MPV: 10.7 fL (ref 7.5–12.5)
NEUTROS PCT: 48.8 %
Neutro Abs: 3026 cells/uL (ref 1500–7800)
Platelets: 252 10*3/uL (ref 140–400)
RBC: 5.61 10*6/uL (ref 4.20–5.80)
RDW: 13.5 % (ref 11.0–15.0)
TOTAL LYMPHOCYTE: 35.1 %
WBC mixed population: 639 cells/uL (ref 200–950)
WBC: 6.2 10*3/uL (ref 3.8–10.8)

## 2018-07-11 LAB — COMPLETE METABOLIC PANEL WITH GFR
AG Ratio: 2 (calc) (ref 1.0–2.5)
ALKALINE PHOSPHATASE (APISO): 72 U/L (ref 40–115)
ALT: 35 U/L (ref 9–46)
AST: 28 U/L (ref 10–40)
Albumin: 4.7 g/dL (ref 3.6–5.1)
BUN: 11 mg/dL (ref 7–25)
CO2: 28 mmol/L (ref 20–32)
CREATININE: 1.17 mg/dL (ref 0.60–1.35)
Calcium: 10.1 mg/dL (ref 8.6–10.3)
Chloride: 107 mmol/L (ref 98–110)
GFR, Est African American: 86 mL/min/{1.73_m2} (ref >=60)
GFR, Est Non African American: 74 mL/min/{1.73_m2} (ref >=60)
GLOBULIN: 2.4 g/dL (ref 1.9–3.7)
Glucose, Bld: 89 mg/dL (ref 65–99)
Potassium: 4.9 mmol/L (ref 3.5–5.3)
SODIUM: 144 mmol/L (ref 135–146)
Total Bilirubin: 0.6 mg/dL (ref 0.2–1.2)
Total Protein: 7.1 g/dL (ref 6.1–8.1)

## 2018-07-11 LAB — TESTOSTERONE: TESTOSTERONE: 385 ng/dL (ref 250–827)

## 2018-07-11 LAB — HIV ANTIBODY (ROUTINE TESTING W REFLEX): HIV 1&2 Ab, 4th Generation: NONREACTIVE

## 2018-07-11 LAB — PSA: PSA: 0.4 ng/mL (ref <=4.0)

## 2018-07-11 NOTE — Progress Notes (Signed)
Please notify pt of labs All labs look great except for elevated cholesterol, will discuss diet for high cholesterol at his OV Friday

## 2018-07-13 ENCOUNTER — Ambulatory Visit: Payer: BLUE CROSS/BLUE SHIELD | Admitting: Family Medicine

## 2018-07-17 ENCOUNTER — Telehealth: Payer: Self-pay | Admitting: Family Medicine

## 2018-07-17 NOTE — Telephone Encounter (Signed)
Please call pt and let him know that his work biometric form is completed and can be picked up at the front desk.

## 2018-07-19 NOTE — Telephone Encounter (Signed)
Call placed to patient to inform him of his paperwork. Line is busy

## 2018-07-20 NOTE — Telephone Encounter (Signed)
Call placed to patient left VM that his paperwork was available for pickup at the front desk

## 2018-09-05 ENCOUNTER — Ambulatory Visit: Payer: BLUE CROSS/BLUE SHIELD | Admitting: Family Medicine

## 2018-09-05 ENCOUNTER — Encounter: Payer: Self-pay | Admitting: Family Medicine

## 2018-09-05 VITALS — BP 130/76 | HR 84 | Temp 98.1°F | Resp 14 | Ht 73.5 in | Wt 190.0 lb

## 2018-09-05 DIAGNOSIS — E785 Hyperlipidemia, unspecified: Secondary | ICD-10-CM | POA: Diagnosis not present

## 2018-09-05 DIAGNOSIS — F329 Major depressive disorder, single episode, unspecified: Secondary | ICD-10-CM | POA: Diagnosis not present

## 2018-09-05 DIAGNOSIS — Z789 Other specified health status: Secondary | ICD-10-CM | POA: Diagnosis not present

## 2018-09-05 DIAGNOSIS — Z7289 Other problems related to lifestyle: Secondary | ICD-10-CM

## 2018-09-05 DIAGNOSIS — F17200 Nicotine dependence, unspecified, uncomplicated: Secondary | ICD-10-CM

## 2018-09-05 DIAGNOSIS — F1090 Alcohol use, unspecified, uncomplicated: Secondary | ICD-10-CM

## 2018-09-05 DIAGNOSIS — L659 Nonscarring hair loss, unspecified: Secondary | ICD-10-CM

## 2018-09-05 MED ORDER — VENLAFAXINE HCL ER 37.5 MG PO CP24: 37.5000 mg | ORAL_CAPSULE | Freq: Every day | ORAL | 0 refills | Status: DC

## 2018-09-05 NOTE — Patient Instructions (Addendum)
RESOURCE GUIDE      Behavioral Health Resources in the Community  Intensive Outpatient Programs: [facility]      [address] [place], [place] [phone] Both a day and evening program       [facility]     [address]        [place], [place] [address] [phone]         ADS: [facility] [address] [place] [phone]  [facility] ACCESS LINE: 1-800-853-5163 or [phone] [address] [place], [place] [address] [email or web address]/adult.htm   Substance Abuse Resources: - [facility]  [phone] - [facility] [phone] - [facility] [phone] - [facility] [phone] - Residential & Outpatient Substance Abuse Program  [phone]  Psychological Services: -  Health  [phone] - [facility]  [phone] - [facility], [address], [place], ACCESS LINE: 1-800-853-5163 or 336-641-4981, Http://www.[email or web address]/adult.htm  Mobile Crisis Teams:                                        [facility]         Mobile Crisis Care Unit 1-877-626-1772             [facility] [address] [place] [phone]                                         Interventionist Sharon DeEsch [address] [place] [phone]  Self-Help/Support Groups: [facility] [place] Variety of support groups [phone] (call for more info)  Narcotics Anonymous (NA) [facility] [address] [place] - 2 meetings at this location  Residential Treatment Programs:  [facility]      [address]        [place]       [phone]         [facility] [address] [place], [place]  [address] [phone]  [facility]  [address] [place], [place] [address] [phone] Admissions: 8am-3pm M-F  [facility]     [address]        [place], [place] [address]       [phone]         [facility] [address] [place], [place] [phone]  [facility] [address] [place], [place] [phone]  [facility] - Intensive Outpatient      [address]     [place], [place]       [phone]         [facility]     [address] [place], [place] [phone] or [phone]  [facility] ([facility]), Medicaid [address] [place], [place] [phone]  [facility]                                               [address] [place]  [phone]  [facility] Resources: [facility]- [phone]               General Therapy                                                Julie Brannon, PhD        [address]                                       [place], [place] [address]         [phone]   Insurance  [facility]   [address] [place], [place] [address] [phone]  [facility] [address] [place], [place] [address] [phone] Insurance/Medicaid/sponsorship through [facility]  [facility]                                              [address]                                        [place], [place] [address]    Therapy/tele-psych/case         [phone]          [facility] [address]   [place], [place]  [address]  Adolescent/group home/case management [phone]                                           Julia Brannon PhD       General therapy       Insurance   [phone]         Dr. Arfeen, Insurance, M-F [phone]  [facility]  [employer] [facility] [address]                 [address]          [address]  [place]                                               [place]                              [place] Phone:  [phone]                                  Phone:  [phone]                   Phone:  [phone]  [facility], [phone] - [facility] - [facility]- [phone]       -     [facility] in , [address],               [phone], Insurance  
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
## 2018-09-08 ENCOUNTER — Encounter: Payer: Self-pay | Admitting: Family Medicine

## 2018-09-08 DIAGNOSIS — F329 Major depressive disorder, single episode, unspecified: Secondary | ICD-10-CM | POA: Insufficient documentation

## 2018-09-08 DIAGNOSIS — F17200 Nicotine dependence, unspecified, uncomplicated: Secondary | ICD-10-CM | POA: Insufficient documentation

## 2019-05-20 ENCOUNTER — Other Ambulatory Visit: Payer: Self-pay

## 2019-05-20 ENCOUNTER — Ambulatory Visit (INDEPENDENT_AMBULATORY_CARE_PROVIDER_SITE_OTHER): Payer: Managed Care, Other (non HMO) | Admitting: Family Medicine

## 2019-05-20 VITALS — BP 128/80 | HR 70 | Temp 98.5°F | Resp 18 | Ht 73.0 in | Wt 204.0 lb

## 2019-05-20 DIAGNOSIS — K625 Hemorrhage of anus and rectum: Secondary | ICD-10-CM

## 2019-05-20 DIAGNOSIS — G5793 Unspecified mononeuropathy of bilateral lower limbs: Secondary | ICD-10-CM

## 2019-05-20 DIAGNOSIS — G4700 Insomnia, unspecified: Secondary | ICD-10-CM

## 2019-05-20 DIAGNOSIS — E782 Mixed hyperlipidemia: Secondary | ICD-10-CM | POA: Diagnosis not present

## 2019-05-20 NOTE — Progress Notes (Signed)
Subjective:    Patient ID: Wayne Weber, male    DOB: [birth year], 48 y.o.   MRN: [id number]  Patient presents for Hemorrhoids  Rectal bleding on and off for the past 3 to 4 years.  Actually saw him back in 2016 when he was having rectal bleed thought to be likely due to hemorrhoids.  He was seen by GI he is supposed to have colonoscopy but he admits that his mother's health with declining and he started self-medicating with alcohol which he is continued to do for the past couple years on and off.  States he is now off of alcohol as well as tobacco.  He was seen by my PA back in September at that time he had lost some weight due to his depression she offered psychotherapy he declined never followed through with this.  He was also prescribed Effexor but he did not take that.  States that he want to deal with it on his own.  He has a girlfriend whom he spoke to about the rectal bleeding who is a Marine scientist.  She did a fecal occult blood test on him and was positive.  He is now ready to proceed with gastroenterology intervention.  He states that he does still like a small knot in the posterior area.  He has been doing Epson salt and using witch hazel which is helped.  He denies any abdominal pain no constipation or straining no diarrhea.     Works at [employer]:  GEN- denies fatigue, fever, weight loss,weakness, recent illness HEENT- denies eye drainage, change in vision, nasal discharge, CVS- denies chest pain, palpitations RESP- denies SOB, cough, wheeze ABD- denies N/V, change in stools, abd pain GU- denies dysuria, hematuria, dribbling, incontinence MSK- denies joint pain, muscle aches, injury Neuro- denies headache, dizziness, syncope, seizure activity       Objective:    BP 128/80   Pulse 70   Temp 98.5 F (36.9 C)   Resp 18   Ht 6\' 1"  (1.854 m)   Wt 204 lb (92.5 kg)   SpO2 97%   BMI 26.91 kg/m  GEN- NAD, alert and oriented x3 CVS- RRR, no  murmur RESP-CTAB ABD-NABS,soft,NT,ND Rectum- external hemorrhoid, no fissure noted, digital exam not performed  EXT- No edema Pulses- Radial 2+        Assessment & Plan:      Problem List Items Addressed This Visit      Unprioritized   Hyperlipidemia - Primary    Check his cholesterol.  He is quit smoking and alcohol recently he denies feeling any shakes or withdrawal symptoms.  He does not want to be on any particular medication.  For his insomnia he can try melatonin over-the-counter.  I think once his body adjusts to be off of alcohol he will likely sleep better also encourage healthy eating and exercise during this period.  He will be referred to gastroenterology for his colonoscopy.  He does have external hemorrhoid which is currently not causing any pain or bleeding he can continue sitz bath with Epson salt he also has over-the-counter suppositories as needed.  He does not have any constipation.  He also stated towards the end of the visit that he gets tingling sensation in his feet this is been going on for quite some time.  He does wear work boots.  He does not have any known diabetes mellitus.  With his history of alcoholism  I going to check a B12 level on him.  He does not have any back issues.      Relevant Orders   Comprehensive metabolic panel (Completed)   Lipid panel (Completed)    Other Visit Diagnoses    Rectal bleeding       Relevant Orders   CBC with Differential/Platelet (Completed)   Ambulatory referral to Gastroenterology   Insomnia, unspecified type       Neuropathy of both feet       Relevant Orders   Vitamin B12 (Completed)      Note: This dictation was prepared with Dragon dictation along with smaller phrase technology. Any transcriptional errors that result from this process are unintentional.

## 2019-05-20 NOTE — Patient Instructions (Addendum)
Referral to GI Try melatonin at bedtime start with 3 to 5mg  at bedtime, take 1 hour before bedtime F/U 3 months for Physical

## 2019-05-21 ENCOUNTER — Other Ambulatory Visit: Payer: Self-pay | Admitting: Family Medicine

## 2019-05-21 ENCOUNTER — Encounter: Payer: Self-pay | Admitting: Family Medicine

## 2019-05-21 LAB — CBC WITH DIFFERENTIAL/PLATELET
Absolute Monocytes: 676 cells/uL (ref 200–950)
Basophils Absolute: 68 cells/uL (ref 0–200)
Basophils Relative: 1.1 %
Eosinophils Absolute: 304 cells/uL (ref 15–500)
Eosinophils Relative: 4.9 %
HCT: 48.6 % (ref 38.5–50.0)
Hemoglobin: 16.6 g/dL (ref 13.2–17.1)
Lymphs Abs: 1965 cells/uL (ref 850–3900)
MCH: 31.6 pg (ref 27.0–33.0)
MCHC: 34.2 g/dL (ref 32.0–36.0)
MCV: 92.6 fL (ref 80.0–100.0)
MPV: 11 fL (ref 7.5–12.5)
Monocytes Relative: 10.9 %
Neutro Abs: 3187 cells/uL (ref 1500–7800)
Neutrophils Relative %: 51.4 %
Platelets: 201 10*3/uL (ref 140–400)
RBC: 5.25 10*6/uL (ref 4.20–5.80)
RDW: 13.6 % (ref 11.0–15.0)
Total Lymphocyte: 31.7 %
WBC: 6.2 10*3/uL (ref 3.8–10.8)

## 2019-05-21 LAB — COMPREHENSIVE METABOLIC PANEL
AG Ratio: 2.1 (calc) (ref 1.0–2.5)
ALT: 124 U/L — ABNORMAL HIGH (ref 9–46)
AST: 101 U/L — ABNORMAL HIGH (ref 10–40)
Albumin: 4.6 g/dL (ref 3.6–5.1)
Alkaline phosphatase (APISO): 52 U/L (ref 36–130)
BUN: 14 mg/dL (ref 7–25)
CO2: 25 mmol/L (ref 20–32)
Calcium: 9.5 mg/dL (ref 8.6–10.3)
Chloride: 107 mmol/L (ref 98–110)
Creat: 1.12 mg/dL (ref 0.60–1.35)
Globulin: 2.2 g/dL (calc) (ref 1.9–3.7)
Glucose, Bld: 80 mg/dL (ref 65–99)
Potassium: 4.3 mmol/L (ref 3.5–5.3)
Sodium: 144 mmol/L (ref 135–146)
Total Bilirubin: 0.5 mg/dL (ref 0.2–1.2)
Total Protein: 6.8 g/dL (ref 6.1–8.1)

## 2019-05-21 LAB — LIPID PANEL
Cholesterol: 258 mg/dL — ABNORMAL HIGH (ref <200)
HDL: 66 mg/dL (ref >=40)
LDL Cholesterol (Calc): 168 mg/dL (calc) — ABNORMAL HIGH
Non-HDL Cholesterol (Calc): 192 mg/dL (calc) — ABNORMAL HIGH (ref <130)
Total CHOL/HDL Ratio: 3.9 (calc) (ref <5.0)
Triglycerides: 117 mg/dL (ref <150)

## 2019-05-21 LAB — VITAMIN B12: Vitamin B-12: 858 pg/mL (ref 200–1100)

## 2019-05-21 NOTE — Assessment & Plan Note (Signed)
Check his cholesterol.  He is quit smoking and alcohol recently he denies feeling any shakes or withdrawal symptoms.  He does not want to be on any particular medication.  For his insomnia he can try melatonin over-the-counter.  I think once his body adjusts to be off of alcohol he will likely sleep better also encourage healthy eating and exercise during this period.  He will be referred to gastroenterology for his colonoscopy.  He does have external hemorrhoid which is currently not causing any pain or bleeding he can continue sitz bath with Epson salt he also has over-the-counter suppositories as needed.  He does not have any constipation.  He also stated towards the end of the visit that he gets tingling sensation in his feet this is been going on for quite some time.  He does wear work boots.  He does not have any known diabetes mellitus.  With his history of alcoholism I going to check a B12 level on him.  He does not have any back issues.

## 2019-05-27 ENCOUNTER — Other Ambulatory Visit: Payer: Self-pay

## 2019-05-27 ENCOUNTER — Encounter (INDEPENDENT_AMBULATORY_CARE_PROVIDER_SITE_OTHER): Payer: Self-pay | Admitting: Internal Medicine

## 2019-05-27 ENCOUNTER — Ambulatory Visit (INDEPENDENT_AMBULATORY_CARE_PROVIDER_SITE_OTHER): Payer: Managed Care, Other (non HMO) | Admitting: Internal Medicine

## 2019-05-27 VITALS — BP 146/77 | HR 91 | Temp 98.2°F | Ht 73.0 in | Wt 206.9 lb

## 2019-05-27 DIAGNOSIS — K625 Hemorrhage of anus and rectum: Secondary | ICD-10-CM

## 2019-05-27 NOTE — Patient Instructions (Signed)
The risks of bleeding, perforation and infection were reviewed with patient.  

## 2019-05-27 NOTE — Progress Notes (Signed)
   Subjective:    Patient ID: Wayne Weber, male    DOB: January 01, 1971, 48 y.o.   MRN: [id number]  HPI Referred by Dr. Buelah Manis for rectal bleeding. Seen back in January of 2017 for same and was scheduled for a colonoscopy. States he stopped drinking during that time and his rectal bleeding resolved. He tells me he has a hemorrhoid, and it feels like it is blocking his rectum. His stool came back positive 2 months ago. He c/o rectal pain. He is having a BM x 1 a day.  Appetite is okay. No weight loss.  No family hx of colon cancer. No etoh in 11 days.  CBC    Component Value Date/Time   WBC 6.2 05/20/2019 0945   RBC 5.25 05/20/2019 0945   HGB 16.6 05/20/2019 0945   HCT 48.6 05/20/2019 0945   PLT 201 05/20/2019 0945   MCV 92.6 05/20/2019 0945   MCH 31.6 05/20/2019 0945   MCHC 34.2 05/20/2019 0945   RDW 13.6 05/20/2019 0945   LYMPHSABS 1,965 05/20/2019 0945   MONOABS 0.8 12/11/2015 1001   EOSABS 304 05/20/2019 0945   BASOSABS 68 05/20/2019 0945    Review of Systems Past Medical History:  Diagnosis Date  . Contact dermatitis 09/03/2013  . Muscle weakness (generalized) 11/23/2012  . Pain in joint, shoulder region 11/23/2012  . Rectal bleeding     Past Surgical History:  Procedure Laterality Date  . ACHILLES TENDON REPAIR    . ROTATOR CUFF REPAIR    . TONSILLECTOMY      No Known Allergies  No current outpatient medications on file prior to visit.   No current facility-administered medications on file prior to visit.         Objective:   Physical Exam Blood pressure (!) 146/77, pulse 91, temperature 98.2 F (36.8 C), height 6\' 1"  (1.854 m), weight 206 lb 14.4 oz (93.8 kg). Alert and oriented. Skin warm and dry. Oral mucosa is moist.   . Sclera anicteric, conjunctivae is pink. Thyroid not enlarged. No cervical lymphadenopathy. Lungs clear. Heart regular rate and rhythm.  Abdomen is soft. Bowel sounds are positive. No hepatomegaly. No abdominal masses felt. No  tenderness.  No edema to lower extremities. Rectal: No masses, Guaiac positive.  Hard area to external rectum.          Assessment & Plan:  Rectal bleeding. Colonic neoplasm needs to be ruled out.

## 2019-06-04 ENCOUNTER — Other Ambulatory Visit (INDEPENDENT_AMBULATORY_CARE_PROVIDER_SITE_OTHER): Payer: Self-pay | Admitting: Internal Medicine

## 2019-06-04 ENCOUNTER — Encounter (INDEPENDENT_AMBULATORY_CARE_PROVIDER_SITE_OTHER): Payer: Self-pay | Admitting: *Deleted

## 2019-06-04 ENCOUNTER — Telehealth (INDEPENDENT_AMBULATORY_CARE_PROVIDER_SITE_OTHER): Payer: Self-pay | Admitting: *Deleted

## 2019-06-04 DIAGNOSIS — K625 Hemorrhage of anus and rectum: Secondary | ICD-10-CM

## 2019-06-04 MED ORDER — PEG 3350-KCL-NA BICARB-NACL 420 G PO SOLR: 4000.0000 mL | Freq: Once | ORAL | 0 refills | Status: AC

## 2019-06-04 NOTE — Telephone Encounter (Signed)
Patient needs trilyte 

## 2019-06-06 ENCOUNTER — Telehealth (INDEPENDENT_AMBULATORY_CARE_PROVIDER_SITE_OTHER): Payer: Self-pay | Admitting: *Deleted

## 2019-06-06 NOTE — Telephone Encounter (Signed)
Patient called and canceled TCS for 06/12/19 - states out of pocket is to much for him right now, he has been out of work since April, he will call me once he's back on his feet financially

## 2019-06-06 NOTE — Telephone Encounter (Signed)
noted 

## 2019-06-07 ENCOUNTER — Other Ambulatory Visit (HOSPITAL_COMMUNITY): Admission: RE | Admit: 2019-06-07 | Payer: Managed Care, Other (non HMO) | Source: Ambulatory Visit

## 2019-06-12 ENCOUNTER — Encounter (HOSPITAL_COMMUNITY): Admission: RE | Payer: Self-pay | Source: Home / Self Care

## 2019-06-12 ENCOUNTER — Ambulatory Visit (HOSPITAL_COMMUNITY)
Admission: RE | Admit: 2019-06-12 | Payer: Managed Care, Other (non HMO) | Source: Home / Self Care | Admitting: Internal Medicine

## 2019-06-12 SURGERY — COLONOSCOPY
Anesthesia: Moderate Sedation

## 2020-04-02 ENCOUNTER — Ambulatory Visit: Payer: Managed Care, Other (non HMO) | Attending: Internal Medicine

## 2020-04-02 DIAGNOSIS — Z23 Encounter for immunization: Secondary | ICD-10-CM

## 2020-04-02 NOTE — Progress Notes (Signed)
   Covid-19 Vaccination Clinic  Name:  Wayne Weber    MRN: [id number] DOB: December 19, 1970  04/02/2020  Mr. [name] was observed post Covid-19 immunization for 15 minutes without incident. He was provided with Vaccine Information Sheet and instruction to access the V-Safe system.   Mr. [name] was instructed to call 911 with any severe reactions post vaccine: Marland Kitchen Difficulty breathing  . Swelling of face and throat  . A fast heartbeat  . A bad rash all over body  . Dizziness and weakness   Immunizations Administered    Name Date Dose VIS Date Route   Moderna COVID-19 Vaccine 04/02/2020  9:43 AM 0.5 mL 11/2019 Intramuscular   Manufacturer: Gala Murdoch   Lot: [id number]   NDC: 86761-950-93      Covid-19 Vaccination Clinic  Name:  Wayne Weber    MRN: [id number] DOB: 02-16-1971  04/02/2020  Mr. [name] was observed post Covid-19 immunization for 15 minutes without incident. He was provided with Vaccine Information Sheet and instruction to access the V-Safe system.   Mr. [name] was instructed to call 911 with any severe reactions post vaccine: Marland Kitchen Difficulty breathing  . Swelling of face and throat  . A fast heartbeat  . A bad rash all over body  . Dizziness and weakness   Immunizations Administered    Name Date Dose VIS Date Route   Moderna COVID-19 Vaccine 04/02/2020  9:43 AM 0.5 mL 11/2019 Intramuscular   Manufacturer: Gala Murdoch   Lot: [id number]   NDC: [id number]

## 2021-02-01 ENCOUNTER — Encounter: Payer: Self-pay | Admitting: Nurse Practitioner

## 2021-02-01 ENCOUNTER — Ambulatory Visit (INDEPENDENT_AMBULATORY_CARE_PROVIDER_SITE_OTHER): Payer: Managed Care, Other (non HMO) | Admitting: Nurse Practitioner

## 2021-02-01 ENCOUNTER — Other Ambulatory Visit: Payer: Self-pay

## 2021-02-01 VITALS — BP 167/94 | HR 92 | Temp 98.6°F | Resp 20 | Ht 73.0 in | Wt 220.0 lb

## 2021-02-01 DIAGNOSIS — G4700 Insomnia, unspecified: Secondary | ICD-10-CM | POA: Insufficient documentation

## 2021-02-01 DIAGNOSIS — Z0001 Encounter for general adult medical examination with abnormal findings: Secondary | ICD-10-CM | POA: Insufficient documentation

## 2021-02-01 DIAGNOSIS — Z789 Other specified health status: Secondary | ICD-10-CM

## 2021-02-01 DIAGNOSIS — Z139 Encounter for screening, unspecified: Secondary | ICD-10-CM | POA: Insufficient documentation

## 2021-02-01 DIAGNOSIS — Z7689 Persons encountering health services in other specified circumstances: Secondary | ICD-10-CM | POA: Insufficient documentation

## 2021-02-01 DIAGNOSIS — Z7289 Other problems related to lifestyle: Secondary | ICD-10-CM

## 2021-02-01 DIAGNOSIS — I10 Essential (primary) hypertension: Secondary | ICD-10-CM | POA: Diagnosis not present

## 2021-02-01 MED ORDER — TRAZODONE HCL 50 MG PO TABS: 25.0000 mg | ORAL_TABLET | Freq: Every evening | ORAL | 3 refills | Status: DC | PRN

## 2021-02-01 MED ORDER — AMLODIPINE BESYLATE 5 MG PO TABS: 5.0000 mg | ORAL_TABLET | Freq: Every day | ORAL | 3 refills | Status: DC

## 2021-02-01 NOTE — Progress Notes (Signed)
New Patient Office Visit  Subjective:  Patient ID: Wayne Weber, male    DOB: 05-17-1971  Age: 50 y.o. MRN: [id number]  CC:  Chief Complaint  Patient presents with  . New Patient (Initial Visit)    HTN     HPI Wayne Weber presents for new patient visit. He had a BP check at work, and his BP was elevated.   He had Dr. Percell Miller with [facility] operate on his left rotator cuff. He has not been cleared to go back to work d/t high BP. He needs his BP < 160/90 to return to work.  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  No recent PCP. No recent physical or labs.  He has been drinking heavily to self-medicate his shoulder pain. He took anti-inflammatories but that didn't help much.  Past Medical History:  Diagnosis Date  . Contact dermatitis 09/03/2013  . Hypertension   . Muscle weakness (generalized) 11/23/2012  . Pain in joint, shoulder region 11/23/2012  . Rectal bleeding     Past Surgical History:  Procedure Laterality Date  . ACHILLES TENDON REPAIR    . ROTATOR CUFF REPAIR    . TONSILLECTOMY      Family History  Problem Relation Age of Onset  . Diabetes Mother   . Hypertension Mother   . Thyroid disease Mother   . Diabetes Father   . Heart disease Father     Social History   Socioeconomic History  . Marital status: Single    Spouse name: Not on file  . Number of children: Not on file  . Years of education: Not on file  . Highest education level: Not on file  Occupational History  . Not on file  Tobacco Use  . Smoking status: Former Smoker    Packs/day: 1.00    Years: 5.00    Pack years: 5.00    Quit date: 09/15/2020    Years since quitting: 0.3  . Smokeless tobacco: Never Used  Vaping Use  . Vaping Use: Never used  Substance and Sexual Activity  . Alcohol use: Yes    Comment: [employer] blue [employer], jack [employer]iels- a fifth lasts 4 days  . Drug use: No  . Sexual activity: Yes    Comment: multiple partners, not regularly using  condoms  Other Topics Concern  . Not on file  Social History Narrative  . Not on file   Social Determinants of Health   Financial Resource Strain: Not on file  Food Insecurity: Not on file  Transportation Needs: Not on file  Physical Activity: Not on file  Stress: Not on file  Social Connections: Not on file  Intimate Partner Violence: Not on file    ROS Review of Systems  Constitutional: Negative.   Respiratory: Negative.   Cardiovascular: Negative.   Neurological: Negative.   Psychiatric/Behavioral: Positive for sleep disturbance. Negative for self-injury and suicidal ideas.    Objective:   Today's Vitals: BP (!) 167/94   Pulse 92   Temp 98.6 F (37 C)   Resp 20   Ht $R'6\' 1"'SJ$  (1.854 m)   Wt 220 lb (99.8 kg)   SpO2 96%   BMI 29.03 kg/m   Physical Exam Constitutional:      Appearance: Normal appearance.  Cardiovascular:     Rate and Rhythm: Normal rate and regular rhythm.     Pulses: Normal pulses.     Heart sounds: Normal heart sounds.  Pulmonary:  Effort: Pulmonary effort is normal.     Breath sounds: Normal breath sounds.  Neurological:     Mental Status: He is alert.  Psychiatric:        Mood and Affect: Mood normal.        Behavior: Behavior normal.        Thought Content: Thought content normal.        Judgment: Judgment normal.     Assessment & Plan:   Problem List Items Addressed This Visit      Cardiovascular and Mediastinum   Hypertension, essential    -BP elevated today; was elevated when he had screeing at his job to return to work -BP needs to be <160/90 to return to work -Rx. Amlodipine -he states he has been drinking heavily to avoid narcotics for his recent left rotator cuff surgery -alcohol dependence may contribute to his high BP      Relevant Medications   amLODipine (NORVASC) 5 MG tablet     Other   Alcohol use    -started drinking heavily after his left rotator cuff surgery -he agreed to stop drinking for at least next  2 weeks, will notify me if he has withdrawal symptoms      Screening due    -will screen for HCV with next set of labs      Relevant Orders   Ambulatory referral to Gastroenterology   HCV Ab w/Rflx to Verification   Encounter to establish care - Primary    -no recent PCP      Relevant Orders   CBC with Differential/Platelet   CMP14+EGFR   Lipid Panel With LDL/HDL Ratio   TSH + free T4   Insomnia    -may be alcohol-related -Rx. trazodone      Relevant Medications   traZODone (DESYREL) 50 MG tablet      Outpatient Encounter Medications as of 02/01/2021  Medication Sig  . amLODipine (NORVASC) 5 MG tablet Take 1 tablet (5 mg total) by mouth daily.  . traZODone (DESYREL) 50 MG tablet Take 0.5-1 tablets (25-50 mg total) by mouth at bedtime as needed for sleep.  . Multiple Vitamin (MULTIVITAMIN WITH MINERALS) TABS tablet Take 1 tablet by mouth daily. (Patient not taking: Reported on 02/01/2021)  . Omega-3 Fatty Acids (FISH OIL PO) Take 1 capsule by mouth daily. (Patient not taking: Reported on 02/01/2021)   No facility-administered encounter medications on file as of 02/01/2021.    Follow-up: Return in about 2 weeks (around 02/15/2021) for Physical exam.   Noreene Larsson, NP

## 2021-02-01 NOTE — Assessment & Plan Note (Signed)
-  BP elevated today; was elevated when he had screeing at his job to return to work -BP needs to be <160/90 to return to work -Rx. Amlodipine -he states he has been drinking heavily to avoid narcotics for his recent left rotator cuff surgery -alcohol dependence may contribute to his high BP

## 2021-02-01 NOTE — Assessment & Plan Note (Signed)
-  will screen for HCV with next set of labs 

## 2021-02-01 NOTE — Assessment & Plan Note (Signed)
-  started drinking heavily after his left rotator cuff surgery -he agreed to stop drinking for at least next 2 weeks, will notify me if he has withdrawal symptoms

## 2021-02-01 NOTE — Assessment & Plan Note (Signed)
-  no recent PCP 

## 2021-02-01 NOTE — Assessment & Plan Note (Signed)
-  may be alcohol-related -Rx. trazodone

## 2021-02-12 LAB — LIPID PANEL WITH LDL/HDL RATIO
Cholesterol, Total: 298 mg/dL — ABNORMAL HIGH (ref 100–199)
HDL: 54 mg/dL (ref >39)
LDL Chol Calc (NIH): 199 mg/dL — ABNORMAL HIGH (ref 0–99)
LDL/HDL Ratio: 3.7 ratio — ABNORMAL HIGH (ref 0.0–3.6)
Triglycerides: 232 mg/dL — ABNORMAL HIGH (ref 0–149)
VLDL Cholesterol Cal: 45 mg/dL — ABNORMAL HIGH (ref 5–40)

## 2021-02-12 LAB — HCV AB W/RFLX TO VERIFICATION: HCV Ab: 0.2 s/co ratio (ref 0.0–0.9)

## 2021-02-12 LAB — HCV INTERPRETATION

## 2021-02-12 LAB — CBC WITH DIFFERENTIAL/PLATELET
Basophils Absolute: 0.1 10*3/uL (ref 0.0–0.2)
Basos: 1 %
EOS (ABSOLUTE): 0.3 10*3/uL (ref 0.0–0.4)
Eos: 5 %
Hematocrit: 42.5 % (ref 37.5–51.0)
Hemoglobin: 14.6 g/dL (ref 13.0–17.7)
Immature Grans (Abs): 0 10*3/uL (ref 0.0–0.1)
Immature Granulocytes: 0 %
Lymphocytes Absolute: 2.4 10*3/uL (ref 0.7–3.1)
Lymphs: 36 %
MCH: 30.4 pg (ref 26.6–33.0)
MCHC: 34.4 g/dL (ref 31.5–35.7)
MCV: 88 fL (ref 79–97)
Monocytes Absolute: 0.8 10*3/uL (ref 0.1–0.9)
Monocytes: 12 %
Neutrophils Absolute: 3.1 10*3/uL (ref 1.4–7.0)
Neutrophils: 46 %
Platelets: 234 10*3/uL (ref 150–450)
RBC: 4.81 x10E6/uL (ref 4.14–5.80)
RDW: 14.1 % (ref 11.6–15.4)
WBC: 6.6 10*3/uL (ref 3.4–10.8)

## 2021-02-12 LAB — CMP14+EGFR
ALT: 81 IU/L — ABNORMAL HIGH (ref 0–44)
AST: 67 IU/L — ABNORMAL HIGH (ref 0–40)
Albumin/Globulin Ratio: 2.2 (ref 1.2–2.2)
Albumin: 5 g/dL (ref 4.0–5.0)
Alkaline Phosphatase: 73 IU/L (ref 44–121)
BUN/Creatinine Ratio: 12 (ref 9–20)
BUN: 13 mg/dL (ref 6–24)
Bilirubin Total: 0.5 mg/dL (ref 0.0–1.2)
CO2: 19 mmol/L — ABNORMAL LOW (ref 20–29)
Calcium: 9.3 mg/dL (ref 8.7–10.2)
Chloride: 102 mmol/L (ref 96–106)
Creatinine, Ser: 1.07 mg/dL (ref 0.76–1.27)
Globulin, Total: 2.3 g/dL (ref 1.5–4.5)
Glucose: 82 mg/dL (ref 65–99)
Potassium: 4 mmol/L (ref 3.5–5.2)
Sodium: 140 mmol/L (ref 134–144)
Total Protein: 7.3 g/dL (ref 6.0–8.5)
eGFR: 85 mL/min/{1.73_m2} (ref >59)

## 2021-02-12 LAB — TSH+FREE T4
Free T4: 0.95 ng/dL (ref 0.82–1.77)
TSH: 3.02 u[IU]/mL (ref 0.450–4.500)

## 2021-02-12 NOTE — Progress Notes (Signed)
LFTs look better than the labs drawn a year ago, but they are still elevated. Also, cholesterol is significantly elevated, so we will talk about adding medication for this at the appointment on 02/15/21.

## 2021-02-15 ENCOUNTER — Other Ambulatory Visit: Payer: Self-pay

## 2021-02-15 ENCOUNTER — Encounter: Payer: Self-pay | Admitting: Nurse Practitioner

## 2021-02-15 ENCOUNTER — Encounter (INDEPENDENT_AMBULATORY_CARE_PROVIDER_SITE_OTHER): Payer: Self-pay | Admitting: *Deleted

## 2021-02-15 ENCOUNTER — Ambulatory Visit (INDEPENDENT_AMBULATORY_CARE_PROVIDER_SITE_OTHER): Payer: Managed Care, Other (non HMO) | Admitting: Nurse Practitioner

## 2021-02-15 VITALS — BP 148/84 | HR 98 | Temp 98.7°F | Resp 20 | Ht 73.0 in | Wt 222.0 lb

## 2021-02-15 DIAGNOSIS — E782 Mixed hyperlipidemia: Secondary | ICD-10-CM

## 2021-02-15 DIAGNOSIS — Z7289 Other problems related to lifestyle: Secondary | ICD-10-CM

## 2021-02-15 DIAGNOSIS — I10 Essential (primary) hypertension: Secondary | ICD-10-CM | POA: Diagnosis not present

## 2021-02-15 DIAGNOSIS — Z139 Encounter for screening, unspecified: Secondary | ICD-10-CM

## 2021-02-15 DIAGNOSIS — H6121 Impacted cerumen, right ear: Secondary | ICD-10-CM | POA: Diagnosis not present

## 2021-02-15 DIAGNOSIS — H6120 Impacted cerumen, unspecified ear: Secondary | ICD-10-CM | POA: Insufficient documentation

## 2021-02-15 DIAGNOSIS — Z789 Other specified health status: Secondary | ICD-10-CM

## 2021-02-15 MED ORDER — DEBROX 6.5 % OT SOLN: 5.0000 [drp] | Freq: Two times a day (BID) | OTIC | 0 refills | Status: DC

## 2021-02-15 MED ORDER — ATORVASTATIN CALCIUM 80 MG PO TABS: 80.0000 mg | ORAL_TABLET | Freq: Every day | ORAL | 3 refills | Status: DC

## 2021-02-15 MED ORDER — AMLODIPINE BESYLATE 10 MG PO TABS: 10.0000 mg | ORAL_TABLET | Freq: Every day | ORAL | 1 refills | Status: DC

## 2021-02-15 NOTE — Addendum Note (Signed)
Addended by: Bjorn Pippin on: 02/15/2021 09:36 AM   Modules accepted: Orders

## 2021-02-15 NOTE — Assessment & Plan Note (Signed)
-  he stopped drinking liquor, but still drinks 6-7 beers per night on nights that he is drinking -may be contributory to his HTN -no withdrawal symptoms reported, but he has not abstained

## 2021-02-15 NOTE — Patient Instructions (Addendum)
The GI doctor's office will call you to set up your appointment for colonoscopy.  We will see you back in 3 months to check labs related to your cholesterol. Please have fasting labs drawn 2-3 days prior to your appointment so we can discuss them at the time of your appointment. If it is hard to get off of work for labs to be drawn, please come to your appointment fasting and we can draw labs at that time. [email or web address]  DASH Eating Plan DASH stands for Dietary Approaches to Stop Hypertension. The DASH eating plan is a healthy eating plan that has been shown to:  Reduce high blood pressure (hypertension).  Reduce your risk for type 2 diabetes, heart disease, and stroke.  Help with weight loss. What are tips for following this plan? Reading food labels  Check food labels for the amount of salt (sodium) per serving. Choose foods with less than 5 percent of the Daily Value of sodium. Generally, foods with less than 300 milligrams (mg) of sodium per serving fit into this eating plan.  To find whole grains, look for the word "whole" as the first word in the ingredient list. Shopping  Buy products labeled as "low-sodium" or "no salt added."  Buy fresh foods. Avoid canned foods and pre-made or frozen meals. Cooking  Avoid adding salt when cooking. Use salt-free seasonings or herbs instead of table salt or sea salt. Check with your health care provider or pharmacist before using salt substitutes.  Do not fry foods. Cook foods using healthy methods such as baking, boiling, grilling, roasting, and broiling instead.  Cook with heart-healthy oils, such as olive, canola, avocado, soybean, or sunflower oil. Meal planning  Eat a balanced diet that includes: ? 4 or more servings of fruits and 4 or more servings of vegetables each day. Try to fill one-half of your plate with fruits and vegetables. ? 6-8 servings of whole grains each day. ? Less  than 6 oz (170 g) of lean meat, poultry, or fish each day. A 3-oz (85-g) serving of meat is about the same size as a deck of cards. One egg equals 1 oz (28 g). ? 2-3 servings of low-fat dairy each day. One serving is 1 cup (237 mL). ? 1 serving of nuts, seeds, or beans 5 times each week. ? 2-3 servings of heart-healthy fats. Healthy fats called omega-3 fatty acids are found in foods such as walnuts, flaxseeds, fortified milks, and eggs. These fats are also found in cold-water fish, such as sardines, salmon, and mackerel.  Limit how much you eat of: ? Canned or prepackaged foods. ? Food that is high in trans fat, such as some fried foods. ? Food that is high in saturated fat, such as fatty meat. ? Desserts and other sweets, sugary drinks, and other foods with added sugar. ? Full-fat dairy products.  Do not salt foods before eating.  Do not eat more than 4 egg yolks a week.  Try to eat at least 2 vegetarian meals a week.  Eat more home-cooked food and less restaurant, buffet, and fast food.   Lifestyle  When eating at a restaurant, ask that your food be prepared with less salt or no salt, if possible.  If you drink alcohol: ? Limit how much you use to:  0-1 drink a day for women who are not pregnant.  0-2 drinks a day for men. ? Be aware of how much alcohol is in your drink. In the [place] one drink equals  one 12 oz bottle of beer (355 mL), one 5 oz glass of wine (148 mL), or one 1 oz glass of hard liquor (44 mL). General information  Avoid eating more than 2,300 mg of salt a day. If you have hypertension, you may need to reduce your sodium intake to 1,500 mg a day.  Work with your health care provider to maintain a healthy body weight or to lose weight. Ask what an ideal weight is for you.  Get at least 30 minutes of exercise that causes your heart to beat faster (aerobic exercise) most days of the week. Activities may include walking, swimming, or biking.  Work with your health  care provider or dietitian to adjust your eating plan to your individual calorie needs. What foods should I eat? Fruits All fresh, dried, or frozen fruit. Canned fruit in natural juice (without added sugar). Vegetables Fresh or frozen vegetables (raw, steamed, roasted, or grilled). Low-sodium or reduced-sodium tomato and vegetable juice. Low-sodium or reduced-sodium tomato sauce and tomato paste. Low-sodium or reduced-sodium canned vegetables. Grains Whole-grain or whole-wheat bread. Whole-grain or whole-wheat pasta. Brown rice. [place]. Bulgur. Whole-grain and low-sodium cereals. Pita bread. Low-fat, low-sodium crackers. Whole-wheat flour tortillas. Meats and other proteins Skinless chicken or [place]. Ground chicken or [place]. Pork with fat trimmed off. Fish and seafood. Egg whites. Dried beans, peas, or lentils. Unsalted nuts, nut butters, and seeds. Unsalted canned beans. Lean cuts of beef with fat trimmed off. Low-sodium, lean precooked or cured meat, such as sausages or meat loaves. Dairy Low-fat (1%) or fat-free (skim) milk. Reduced-fat, low-fat, or fat-free cheeses. Nonfat, low-sodium ricotta or cottage cheese. Low-fat or nonfat yogurt. Low-fat, low-sodium cheese. Fats and oils Soft margarine without trans fats. Vegetable oil. Reduced-fat, low-fat, or light mayonnaise and salad dressings (reduced-sodium). Canola, safflower, olive, avocado, soybean, and sunflower oils. Avocado. Seasonings and condiments Herbs. Spices. Seasoning mixes without salt. Other foods Unsalted popcorn and pretzels. Fat-free sweets. The items listed above may not be a complete list of foods and beverages you can eat. Contact a dietitian for more information. What foods should I avoid? Fruits Canned fruit in a light or heavy syrup. Fried fruit. Fruit in cream or butter sauce. Vegetables Creamed or fried vegetables. Vegetables in a cheese sauce. Regular canned vegetables (not low-sodium or reduced-sodium).  Regular canned tomato sauce and paste (not low-sodium or reduced-sodium). Regular tomato and vegetable juice (not low-sodium or reduced-sodium). Angie Fava. Olives. Grains Baked goods made with fat, such as croissants, muffins, or some breads. Dry pasta or rice meal packs. Meats and other proteins Fatty cuts of meat. Ribs. Fried meat. Berniece Salines. Bologna, salami, and other precooked or cured meats, such as sausages or meat loaves. Fat from the back of a pig (fatback). Bratwurst. Salted nuts and seeds. Canned beans with added salt. Canned or smoked fish. Whole eggs or egg yolks. Chicken or [place] with skin. Dairy Whole or 2% milk, cream, and half-and-half. Whole or full-fat cream cheese. Whole-fat or sweetened yogurt. Full-fat cheese. Nondairy creamers. Whipped toppings. Processed cheese and cheese spreads. Fats and oils Butter. Stick margarine. Lard. Shortening. Ghee. Bacon fat. Tropical oils, such as coconut, palm kernel, or palm oil. Seasonings and condiments Onion salt, garlic salt, seasoned salt, table salt, and sea salt. Worcestershire sauce. Tartar sauce. Barbecue sauce. Teriyaki sauce. Soy sauce, including reduced-sodium. Steak sauce. Canned and packaged gravies. Fish sauce. Oyster sauce. Cocktail sauce. Store-bought horseradish. Ketchup. Mustard. Meat flavorings and tenderizers. Bouillon cubes. Hot sauces. Pre-made or packaged marinades. Pre-made or packaged taco  seasonings. Relishes. Regular salad dressings. Other foods Salted popcorn and pretzels. The items listed above may not be a complete list of foods and beverages you should avoid. Contact a dietitian for more information. Where to find more information  National Heart, Lung, and Blood Institute: PopSteam.is  American Heart Association: www.heart.org  Academy of Nutrition and Dietetics: www.eatright.org  National Kidney Foundation: www.kidney.org Summary  The DASH eating plan is a healthy eating plan that has been shown to  reduce high blood pressure (hypertension). It may also reduce your risk for type 2 diabetes, heart disease, and stroke.  When on the DASH eating plan, aim to eat more fresh fruits and vegetables, whole grains, lean proteins, low-fat dairy, and heart-healthy fats.  With the DASH eating plan, you should limit salt (sodium) intake to 2,300 mg a day. If you have hypertension, you may need to reduce your sodium intake to 1,500 mg a day.  Work with your health care provider or dietitian to adjust your eating plan to your individual calorie needs. This information is not intended to replace advice given to you by your health care provider. Make sure you discuss any questions you have with your health care provider. Document Revised: 11/01/2019 Document Reviewed: 11/01/2019 Elsevier Patient Education  2021 Elsevier Inc.  Preventing High Cholesterol Cholesterol is a white, waxy substance similar to fat that the human body needs to help build cells. The liver makes all the cholesterol that a person's body needs. Having high cholesterol (hypercholesterolemia) increases your risk for heart disease and stroke. Extra or excess cholesterol comes from the food that you eat. High cholesterol can often be prevented with diet and lifestyle changes. If you already have high cholesterol, you can control it with diet, lifestyle changes, and medicines. How can high cholesterol affect me? If you have high cholesterol, fatty deposits (plaques) may build up on the walls of your blood vessels. The blood vessels that carry blood away from your heart are called arteries. Plaques make the arteries narrower and stiffer. This in turn can:  Restrict or block blood flow and cause blood clots to form.  Increase your risk for heart attack and stroke. What can increase my risk for high cholesterol? This condition is more likely to develop in people who:  Eat foods that are high in saturated fat or cholesterol. Saturated fat is  mostly found in foods that come from animal sources.  Are overweight.  Are not getting enough exercise.  Have a family history of high cholesterol (familial hypercholesterolemia). What actions can I take to prevent this? Nutrition  Eat less saturated fat.  Avoid trans fats (partially hydrogenated oils). These are often found in margarine and in some baked goods, fried foods, and snacks bought in packages.  Avoid precooked or cured meat, such as bacon, sausages, or meat loaves.  Avoid foods and drinks that have added sugars.  Eat more fruits, vegetables, and whole grains.  Choose healthy sources of protein, such as fish, poultry, lean cuts of red meat, beans, peas, lentils, and nuts.  Choose healthy sources of fat, such as: ? Nuts. ? Vegetable oils, especially olive oil. ? Fish that have healthy fats, such as omega-3 fatty acids. These fish include mackerel or salmon.   Lifestyle  Lose weight if you are overweight. Maintaining a healthy body mass index (BMI) can help prevent or control high cholesterol. It can also lower your risk for diabetes and high blood pressure. Ask your health care provider to help you with a  diet and exercise plan to lose weight safely.  Do not use any products that contain nicotine or tobacco, such as cigarettes, e-cigarettes, and chewing tobacco. If you need help quitting, ask your health care provider. Alcohol use  Do not drink alcohol if: ? Your health care provider tells you not to drink. ? You are pregnant, may be pregnant, or are planning to become pregnant.  If you drink alcohol: ? Limit how much you use to:  0-1 drink a day for women.  0-2 drinks a day for men. ? Be aware of how much alcohol is in your drink. In the [place] one drink equals one 12 oz bottle of beer (355 mL), one 5 oz glass of wine (148 mL), or one 1 oz glass of hard liquor (44 mL). Activity  Get enough exercise. Do exercises as told by your health care provider.  Each  week, do at least 150 minutes of exercise that takes a medium level of effort (moderate-intensity exercise). This kind of exercise: ? Makes your heart beat faster while allowing you to still be able to talk. ? Can be done in short sessions several times a day or longer sessions a few times a week. For example, on 5 days each week, you could walk fast or ride your bike 3 times a day for 10 minutes each time.   Medicines  Your health care provider may recommend medicines to help lower cholesterol. This may be a medicine to lower the amount of cholesterol that your liver makes. You may need medicine if: ? Diet and lifestyle changes have not lowered your cholesterol enough. ? You have high cholesterol and other risk factors for heart disease or stroke.  Take over-the-counter and prescription medicines only as told by your health care provider. General information  Manage your risk factors for high cholesterol. Talk with your health care provider about all your risk factors and how to lower your risk.  Manage other conditions that you have, such as diabetes or high blood pressure (hypertension).  Have blood tests to check your cholesterol levels at regular points in time as told by your health care provider.  Keep all follow-up visits as told by your health care provider. This is important. Where to find more information  American Heart Association: www.heart.org  National Heart, Lung, and Blood Institute: PopSteam.is Summary  High cholesterol increases your risk for heart disease and stroke. By keeping your cholesterol level low, you can reduce your risk for these conditions.  High cholesterol can often be prevented with diet and lifestyle changes.  Work with your health care provider to manage your risk factors, and have your blood tested regularly. This information is not intended to replace advice given to you by your health care provider. Make sure you discuss any questions you  have with your health care provider. Document Revised: 09/10/2019 Document Reviewed: 09/10/2019 Elsevier Patient Education  2021 [employer].

## 2021-02-15 NOTE — Assessment & Plan Note (Signed)
-   referral to GI for colonoscopy

## 2021-02-15 NOTE — Progress Notes (Addendum)
Established Patient Office Visit  Subjective:  Patient ID: Wayne Weber, male    DOB: [birth year]  Age: 50 y.o. MRN: [id number]  CC:  Chief Complaint  Patient presents with  . Annual Exam    HPI [name] presents for physical exam.  His labs were drawn prior to this visit and LFTs look better than the labs drawn a year ago, but they are still elevated. Also, cholesterol is significantly elevated.  He had Dr. Percell Miller with [facility] operate on his left rotator cuff. He has not been cleared to go back to work d/t high BP. He needs his BP < 160/90 to return to work. At his last OV, he was started on amlodipine 5 mg daily.  He doesn't check his BP at home.  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  No recent PCP. No recent physical or labs.  He has been drinking heavily to self-medicate his shoulder pain. He took anti-inflammatories but that didn't help much. He states he stopped drinking liquor and he cut back on his salt.  He is drinking 6-7 beers on his days off. Past Medical History:  Diagnosis Date  . Contact dermatitis 09/03/2013  . Hypertension   . Muscle weakness (generalized) 11/23/2012  . Pain in joint, shoulder region 11/23/2012  . Rectal bleeding     Past Surgical History:  Procedure Laterality Date  . ACHILLES TENDON REPAIR    . ROTATOR CUFF REPAIR    . TONSILLECTOMY      Family History  Problem Relation Age of Onset  . Diabetes Mother   . Hypertension Mother   . Thyroid disease Mother   . Diabetes Father   . Heart disease Father     Social History   Socioeconomic History  . Marital status: Single    Spouse name: Not on file  . Number of children: Not on file  . Years of education: Not on file  . Highest education level: Not on file  Occupational History  . Not on file  Tobacco Use  . Smoking status: Former Smoker    Packs/day: 1.00    Years: 5.00    Pack years: 5.00    Quit date: 09/15/2020    Years since  quitting: 0.4  . Smokeless tobacco: Never Used  Vaping Use  . Vaping Use: Never used  Substance and Sexual Activity  . Alcohol use: Yes    Comment: pabst blue ribbon, jack daniels- a fifth lasts 4 days  . Drug use: No  . Sexual activity: Yes    Comment: multiple partners, not regularly using condoms  Other Topics Concern  . Not on file  Social History Narrative  . Not on file   Social Determinants of Health   Financial Resource Strain: Not on file  Food Insecurity: Not on file  Transportation Needs: Not on file  Physical Activity: Not on file  Stress: Not on file  Social Connections: Not on file  Intimate Partner Violence: Not on file    Outpatient Medications Prior to Visit  Medication Sig Dispense Refill  . Multiple Vitamin (MULTIVITAMIN WITH MINERALS) TABS tablet Take 1 tablet by mouth daily.    . Omega-3 Fatty Acids (FISH OIL PO) Take 1 capsule by mouth daily.    . traZODone (DESYREL) 50 MG tablet Take 0.5-1 tablets (25-50 mg total) by mouth at bedtime as needed for sleep. 30 tablet 3  . amLODipine (NORVASC) 5 MG tablet Take 1 tablet (  5 mg total) by mouth daily. 90 tablet 3   No facility-administered medications prior to visit.    No Known Allergies  ROS Review of Systems  Constitutional: Negative.   HENT: Negative.   Eyes: Negative.   Respiratory: Negative.   Cardiovascular: Negative.   Gastrointestinal: Negative.   Endocrine: Negative.   Genitourinary: Negative.   Musculoskeletal: Negative.   Skin: Negative.   Allergic/Immunologic: Negative.   Neurological: Negative.   Hematological: Negative.   Psychiatric/Behavioral: Negative for self-injury, sleep disturbance and suicidal ideas.       Drinks heavily when drinking 6-7 beers on nights he is drinking; doesn't drink every night      Objective:    Physical Exam Constitutional:      Appearance: Normal appearance.  HENT:     Head: Normocephalic and atraumatic.     Right Ear: External ear normal.  There is impacted cerumen.     Left Ear: Tympanic membrane, ear canal and external ear normal.     Nose: Nose normal.     Mouth/Throat:     Mouth: Mucous membranes are moist.     Pharynx: Oropharynx is clear.  Eyes:     Extraocular Movements: Extraocular movements intact.     Conjunctiva/sclera: Conjunctivae normal.     Pupils: Pupils are equal, round, and reactive to light.  Cardiovascular:     Rate and Rhythm: Normal rate and regular rhythm.     Pulses: Normal pulses.     Heart sounds: Normal heart sounds.  Pulmonary:     Effort: Pulmonary effort is normal.     Breath sounds: Normal breath sounds.  Abdominal:     General: Abdomen is flat. Bowel sounds are normal.     Palpations: Abdomen is soft.  Musculoskeletal:        General: Normal range of motion.     Cervical back: Normal range of motion and neck supple.  Skin:    General: Skin is warm and dry.     Capillary Refill: Capillary refill takes less than 2 seconds.  Neurological:     General: No focal deficit present.     Mental Status: He is alert and oriented to person, place, and time.     Cranial Nerves: No cranial nerve deficit.  Psychiatric:        Mood and Affect: Mood normal.        Behavior: Behavior normal.        Thought Content: Thought content normal.        Judgment: Judgment normal.     BP (!) 148/84   Pulse 98   Temp 98.7 F (37.1 C)   Resp 20   Ht $R'6\' 1"'Lg$  (1.854 m)   Wt 222 lb (100.7 kg)   SpO2 95%   BMI 29.29 kg/m  Wt Readings from Last 3 Encounters:  02/15/21 222 lb (100.7 kg)  02/01/21 220 lb (99.8 kg)  05/27/19 206 lb 14.4 oz (93.8 kg)     Health Maintenance Due  Topic Date Due  . COLONOSCOPY (Pts 45-32yrs Insurance coverage will need to be confirmed)  Never done    There are no preventive care reminders to display for this patient.  Lab Results  Component Value Date   TSH 3.020 02/11/2021   Lab Results  Component Value Date   WBC 6.6 02/11/2021   HGB 14.6 02/11/2021   HCT  42.5 02/11/2021   MCV 88 02/11/2021   PLT 234 02/11/2021   Lab Results  Component Value  Date   NA 140 02/11/2021   K 4.0 02/11/2021   CO2 19 (L) 02/11/2021   GLUCOSE 82 02/11/2021   BUN 13 02/11/2021   CREATININE 1.07 02/11/2021   BILITOT 0.5 02/11/2021   ALKPHOS 73 02/11/2021   AST 67 (H) 02/11/2021   ALT 81 (H) 02/11/2021   PROT 7.3 02/11/2021   ALBUMIN 5.0 02/11/2021   CALCIUM 9.3 02/11/2021   Lab Results  Component Value Date   CHOL 298 (H) 02/11/2021   Lab Results  Component Value Date   HDL 54 02/11/2021   Lab Results  Component Value Date   LDLCALC 199 (H) 02/11/2021   Lab Results  Component Value Date   TRIG 232 (H) 02/11/2021   Lab Results  Component Value Date   CHOLHDL 3.9 05/20/2019   No results found for: HGBA1C    Assessment & Plan:   Problem List Items Addressed This Visit      Cardiovascular and Mediastinum   Hypertension, essential    -BP still elevated, but low enough that ortho should clear him to return to work -INCREASE amlodipine to 10 mg daily      Relevant Medications   amLODipine (NORVASC) 10 MG tablet   atorvastatin (LIPITOR) 80 MG tablet   Other Relevant Orders   CBC with Differential/Platelet   CMP14+EGFR     Nervous and Auditory   Cerumen impaction    -right ear irrigated today -he has been using debrox at home        Other   Alcohol use    -he stopped drinking liquor, but still drinks 6-7 beers per night on nights that he is drinking -may be contributory to his HTN -no withdrawal symptoms reported, but he has not abstained      Hyperlipidemia    Lab Results  Component Value Date   CHOL 298 (H) 02/11/2021   HDL 54 02/11/2021   LDLCALC 199 (H) 02/11/2021   TRIG 232 (H) 02/11/2021   CHOLHDL 3.9 05/20/2019  -INCREASE fish oil to 3g per day -Rx. atorvastatin       Relevant Medications   amLODipine (NORVASC) 10 MG tablet   atorvastatin (LIPITOR) 80 MG tablet   Other Relevant Orders   Lipid Panel With  LDL/HDL Ratio   Screening due - Primary    -referral to GI for colonoscopy      Relevant Orders   Ambulatory referral to Gastroenterology      Meds ordered this encounter  Medications  . amLODipine (NORVASC) 10 MG tablet    Sig: Take 1 tablet (10 mg total) by mouth daily.    Dispense:  90 tablet    Refill:  1  . atorvastatin (LIPITOR) 80 MG tablet    Sig: Take 1 tablet (80 mg total) by mouth daily.    Dispense:  90 tablet    Refill:  3  . carbamide peroxide (DEBROX) 6.5 % OTIC solution    Sig: Place 5 drops into the right ear 2 (two) times daily.    Dispense:  15 mL    Refill:  0    Follow-up: Return in about 3 months (around 05/18/2021) for Lab follow-up (cholesterol).    Noreene Larsson, NP

## 2021-02-15 NOTE — Assessment & Plan Note (Signed)
Lab Results  Component Value Date   CHOL 298 (H) 02/11/2021   HDL 54 02/11/2021   LDLCALC 199 (H) 02/11/2021   TRIG 232 (H) 02/11/2021   CHOLHDL 3.9 05/20/2019  -INCREASE fish oil to 3g per day -Rx. atorvastatin

## 2021-02-15 NOTE — Assessment & Plan Note (Signed)
-  right ear irrigated today -he has been using debrox at home

## 2021-02-15 NOTE — Assessment & Plan Note (Signed)
-  BP still elevated, but low enough that ortho should clear him to return to work -INCREASE amlodipine to 10 mg daily

## 2021-02-23 ENCOUNTER — Other Ambulatory Visit: Payer: Self-pay | Admitting: Nurse Practitioner

## 2021-02-23 DIAGNOSIS — G4700 Insomnia, unspecified: Secondary | ICD-10-CM

## 2021-02-25 ENCOUNTER — Ambulatory Visit: Payer: Managed Care, Other (non HMO) | Admitting: Nurse Practitioner

## 2021-03-16 ENCOUNTER — Encounter: Payer: Self-pay | Admitting: Internal Medicine

## 2021-03-16 ENCOUNTER — Other Ambulatory Visit: Payer: Self-pay

## 2021-03-16 ENCOUNTER — Ambulatory Visit (INDEPENDENT_AMBULATORY_CARE_PROVIDER_SITE_OTHER): Payer: Managed Care, Other (non HMO) | Admitting: Internal Medicine

## 2021-03-16 VITALS — BP 165/93 | HR 85 | Resp 18 | Ht 74.0 in | Wt 221.4 lb

## 2021-03-16 DIAGNOSIS — L239 Allergic contact dermatitis, unspecified cause: Secondary | ICD-10-CM | POA: Diagnosis not present

## 2021-03-16 MED ORDER — METHYLPREDNISOLONE 4 MG PO TBPK: ORAL_TABLET | ORAL | 0 refills | Status: DC

## 2021-03-16 NOTE — Progress Notes (Signed)
Acute Office Visit  Subjective:    Patient ID: Wayne Weber, male    DOB: [birth year], 50 y.o.   MRN: [id number]  Chief Complaint  Patient presents with  . Allergic Reaction    Pt cleaned washer with borax then washed clothes has been broke out since yesterday itching     HPI Patient is in today for evaluation of allergic reaction to detergent.  He has been having generalized itching and redness in the axillary and upper back area. He denies any dyspnea, wheezing or lip swelling. Has tried Benadryl with minimal relief.   Past Medical History:  Diagnosis Date  . Contact dermatitis 09/03/2013  . Hypertension   . Muscle weakness (generalized) 11/23/2012  . Pain in joint, shoulder region 11/23/2012  . Rectal bleeding     Past Surgical History:  Procedure Laterality Date  . ACHILLES TENDON REPAIR    . ROTATOR CUFF REPAIR    . TONSILLECTOMY      Family History  Problem Relation Age of Onset  . Diabetes Mother   . Hypertension Mother   . Thyroid disease Mother   . Diabetes Father   . Heart disease Father     Social History   Socioeconomic History  . Marital status: Single    Spouse name: Not on file  . Number of children: Not on file  . Years of education: Not on file  . Highest education level: Not on file  Occupational History  . Not on file  Tobacco Use  . Smoking status: Former Smoker    Packs/day: 1.00    Years: 5.00    Pack years: 5.00    Quit date: 09/15/2020    Years since quitting: 0.4  . Smokeless tobacco: Never Used  Vaping Use  . Vaping Use: Never used  Substance and Sexual Activity  . Alcohol use: Yes    Comment: pabst blue ribbon, jack daniels- a fifth lasts 4 days  . Drug use: No  . Sexual activity: Yes    Comment: multiple partners, not regularly using condoms  Other Topics Concern  . Not on file  Social History Narrative  . Not on file   Social Determinants of Health   Financial Resource Strain: Not on file  Food Insecurity:  Not on file  Transportation Needs: Not on file  Physical Activity: Not on file  Stress: Not on file  Social Connections: Not on file  Intimate Partner Violence: Not on file    Outpatient Medications Prior to Visit  Medication Sig Dispense Refill  . carbamide peroxide (DEBROX) 6.5 % OTIC solution Place 5 drops into the right ear 2 (two) times daily. 15 mL 0  . amLODipine (NORVASC) 10 MG tablet Take 1 tablet (10 mg total) by mouth daily. (Patient not taking: Reported on 03/16/2021) 90 tablet 1  . atorvastatin (LIPITOR) 80 MG tablet Take 1 tablet (80 mg total) by mouth daily. (Patient not taking: Reported on 03/16/2021) 90 tablet 3  . Multiple Vitamin (MULTIVITAMIN WITH MINERALS) TABS tablet Take 1 tablet by mouth daily. (Patient not taking: Reported on 03/16/2021)    . Omega-3 Fatty Acids (FISH OIL PO) Take 1 capsule by mouth daily. (Patient not taking: Reported on 03/16/2021)    . traZODone (DESYREL) 50 MG tablet TAKE 0.5-1 TABLETS BY MOUTH AT BEDTIME AS NEEDED FOR SLEEP. (Patient not taking: Reported on 03/16/2021) 90 tablet 2   No facility-administered medications prior to visit.    No Known Allergies  Review of Systems  Constitutional: Negative for chills and fever.  HENT: Negative for congestion, rhinorrhea, sinus pressure and sinus pain.   Respiratory: Negative for cough and shortness of breath.   Cardiovascular: Negative for chest pain and palpitations.  Gastrointestinal: Negative for nausea and vomiting.  Musculoskeletal: Negative for arthralgias and myalgias.  Skin: Positive for rash.  Neurological: Negative for dizziness and weakness.       Objective:    Physical Exam Vitals reviewed.  Constitutional:      Appearance: He is obese.  HENT:     Head: Normocephalic and atraumatic.     Mouth/Throat:     Mouth: Mucous membranes are moist.  Eyes:     General: No scleral icterus.    Extraocular Movements: Extraocular movements intact.  Cardiovascular:     Rate and Rhythm: Normal  rate and regular rhythm.     Pulses: Normal pulses.     Heart sounds: Normal heart sounds.  Pulmonary:     Breath sounds: Normal breath sounds. No wheezing or rales.  Skin:    General: Skin is warm.     Findings: Rash (Erythematous patches over the upper back and axillary area) present.  Neurological:     General: No focal deficit present.     Mental Status: He is alert and oriented to person, place, and time.  Psychiatric:        Mood and Affect: Mood normal.        Behavior: Behavior normal.     BP (!) 165/93 (BP Location: Right Arm, Patient Position: Sitting, Cuff Size: Normal)   Pulse 85   Resp 18   Ht 6\' 2"  (1.88 m)   Wt 221 lb 6.4 oz (100.4 kg)   SpO2 98%   BMI 28.43 kg/m  Wt Readings from Last 3 Encounters:  03/16/21 221 lb 6.4 oz (100.4 kg)  02/15/21 222 lb (100.7 kg)  02/01/21 220 lb (99.8 kg)    Health Maintenance Due  Topic Date Due  . COLONOSCOPY (Pts 45-66yrs Insurance coverage will need to be confirmed)  Never done    There are no preventive care reminders to display for this patient.   Lab Results  Component Value Date   TSH 3.020 02/11/2021   Lab Results  Component Value Date   WBC 6.6 02/11/2021   HGB 14.6 02/11/2021   HCT 42.5 02/11/2021   MCV 88 02/11/2021   PLT 234 02/11/2021   Lab Results  Component Value Date   NA 140 02/11/2021   K 4.0 02/11/2021   CO2 19 (L) 02/11/2021   GLUCOSE 82 02/11/2021   BUN 13 02/11/2021   CREATININE 1.07 02/11/2021   BILITOT 0.5 02/11/2021   ALKPHOS 73 02/11/2021   AST 67 (H) 02/11/2021   ALT 81 (H) 02/11/2021   PROT 7.3 02/11/2021   ALBUMIN 5.0 02/11/2021   CALCIUM 9.3 02/11/2021   Lab Results  Component Value Date   CHOL 298 (H) 02/11/2021   Lab Results  Component Value Date   HDL 54 02/11/2021   Lab Results  Component Value Date   LDLCALC 199 (H) 02/11/2021   Lab Results  Component Value Date   TRIG 232 (H) 02/11/2021   Lab Results  Component Value Date   CHOLHDL 3.9 05/20/2019    No results found for: HGBA1C     Assessment & Plan:   Problem List Items Addressed This Visit   None   Visit Diagnoses    Allergic dermatitis    -  Primary To detergent/cleaning  supply Prescribed Medrol dosepak Advised to take Zyrtec for itching Avoid new chemical exposure if possible as he has h/o allergic reaction to other cleaning supply in the past.   Relevant Medications   methylPREDNISolone (MEDROL DOSEPAK) 4 MG TBPK tablet       Meds ordered this encounter  Medications  . methylPREDNISolone (MEDROL DOSEPAK) 4 MG TBPK tablet    Sig: Take as package instructions.    Dispense:  1 each    Refill:  0     Yazid Pop Concha Se, MD

## 2021-03-16 NOTE — Patient Instructions (Signed)
Contact Dermatitis Dermatitis is redness, soreness, and swelling (inflammation) of the skin. Contact dermatitis is a reaction to something that touches the skin. There are two types of contact dermatitis:  Irritant contact dermatitis. This happens when something bothers (irritates) your skin, like soap.  Allergic contact dermatitis. This is caused when you are exposed to something that you are allergic to, such as poison ivy. What are the causes?  Common causes of irritant contact dermatitis include: ? Makeup. ? Soaps. ? Detergents. ? Bleaches. ? Acids. ? Metals, such as nickel.  Common causes of allergic contact dermatitis include: ? Plants. ? Chemicals. ? Jewelry. ? Latex. ? Medicines. ? Preservatives in products, such as clothing. What increases the risk?  Having a job that exposes you to things that bother your skin.  Having asthma or eczema. What are the signs or symptoms? Symptoms may happen anywhere the irritant has touched your skin. Symptoms include:  Dry or flaky skin.  Redness.  Cracks.  Itching.  Pain or a burning feeling.  Blisters.  Blood or clear fluid draining from skin cracks. With allergic contact dermatitis, swelling may occur. This may happen in places such as the eyelids, mouth, or genitals.   How is this treated?  This condition is treated by checking for the cause of the reaction and protecting your skin. Treatment may also include: ? Steroid creams, ointments, or medicines. ? Antibiotic medicines or other ointments, if you have a skin infection. ? Lotion or medicines to help with itching. ? A bandage (dressing). Follow these instructions at home: Skin care  Moisturize your skin as needed.  Put cool cloths on your skin.  Put a baking soda paste on your skin. Stir water into baking soda until it looks like a paste.  Do not scratch your skin.  Avoid having things rub up against your skin.  Avoid the use of soaps, perfumes, and  dyes. Medicines  Take or apply over-the-counter and prescription medicines only as told by your doctor.  If you were prescribed an antibiotic medicine, take or apply it as told by your doctor. Do not stop using it even if your condition starts to get better. Bathing  Take a bath with: ? Epsom salts. ? Baking soda. ? Colloidal oatmeal.  Bathe less often.  Bathe in warm water. Avoid using hot water. Bandage care  If you were given a bandage, change it as told by your health care provider.  Wash your hands with soap and water before and after you change your bandage. If soap and water are not available, use hand sanitizer. General instructions  Avoid the things that caused your reaction. If you do not know what caused it, keep a journal. Write down: ? What you eat. ? What skin products you use. ? What you drink. ? What you wear in the area that has symptoms. This includes jewelry.  Check the affected areas every day for signs of infection. Check for: ? More redness, swelling, or pain. ? More fluid or blood. ? Warmth. ? Pus or a bad smell.  Keep all follow-up visits as told by your doctor. This is important. Contact a doctor if:  You do not get better with treatment.  Your condition gets worse.  You have signs of infection, such as: ? More swelling. ? Tenderness. ? More redness. ? Soreness. ? Warmth.  You have a fever.  You have new symptoms. Get help right away if:  You have a very bad headache.  You have   neck pain.  Your neck is stiff.  You throw up (vomit).  You feel very sleepy.  You see red streaks coming from the area.  Your bone or joint near the area hurts after the skin has healed.  The area turns darker.  You have trouble breathing. Summary  Dermatitis is redness, soreness, and swelling of the skin.  Symptoms may occur where the irritant has touched you.  Treatment may include medicines and skin care.  If you do not know what caused  your reaction, keep a journal.  Contact a doctor if your condition gets worse or you have signs of infection. This information is not intended to replace advice given to you by your health care provider. Make sure you discuss any questions you have with your health care provider. Document Revised: 03/20/2019 Document Reviewed: 06/13/2018 [employer]vier Patient Education  2021 [employer]  

## 2021-05-18 ENCOUNTER — Ambulatory Visit: Payer: Managed Care, Other (non HMO) | Admitting: Nurse Practitioner

## 2021-06-28 ENCOUNTER — Other Ambulatory Visit: Payer: Self-pay

## 2021-06-28 DIAGNOSIS — G4700 Insomnia, unspecified: Secondary | ICD-10-CM

## 2021-06-28 MED ORDER — TRAZODONE HCL 50 MG PO TABS: ORAL_TABLET | ORAL | 2 refills | Status: DC

## 2021-08-13 ENCOUNTER — Other Ambulatory Visit: Payer: Self-pay | Admitting: Nurse Practitioner

## 2021-08-13 DIAGNOSIS — I10 Essential (primary) hypertension: Secondary | ICD-10-CM

## 2021-08-25 DIAGNOSIS — D126 Benign neoplasm of colon, unspecified: Secondary | ICD-10-CM | POA: Insufficient documentation

## 2022-04-01 ENCOUNTER — Encounter: Payer: Self-pay | Admitting: Family Medicine

## 2022-04-01 ENCOUNTER — Ambulatory Visit (INDEPENDENT_AMBULATORY_CARE_PROVIDER_SITE_OTHER): Payer: Managed Care, Other (non HMO) | Admitting: Family Medicine

## 2022-04-01 VITALS — BP 142/86 | HR 97 | Ht 73.0 in | Wt 205.0 lb

## 2022-04-01 DIAGNOSIS — E559 Vitamin D deficiency, unspecified: Secondary | ICD-10-CM | POA: Diagnosis not present

## 2022-04-01 DIAGNOSIS — F17200 Nicotine dependence, unspecified, uncomplicated: Secondary | ICD-10-CM

## 2022-04-01 DIAGNOSIS — Z23 Encounter for immunization: Secondary | ICD-10-CM

## 2022-04-01 DIAGNOSIS — I10 Essential (primary) hypertension: Secondary | ICD-10-CM

## 2022-04-01 DIAGNOSIS — N529 Male erectile dysfunction, unspecified: Secondary | ICD-10-CM | POA: Diagnosis not present

## 2022-04-01 DIAGNOSIS — R7301 Impaired fasting glucose: Secondary | ICD-10-CM | POA: Diagnosis not present

## 2022-04-01 DIAGNOSIS — Z789 Other specified health status: Secondary | ICD-10-CM

## 2022-04-01 MED ORDER — SILDENAFIL CITRATE 100 MG PO TABS: 50.0000 mg | ORAL_TABLET | Freq: Every day | ORAL | 11 refills | Status: DC | PRN

## 2022-04-01 MED ORDER — AMLODIPINE BESYLATE 10 MG PO TABS: 10.0000 mg | ORAL_TABLET | Freq: Every day | ORAL | 1 refills | Status: DC

## 2022-04-01 NOTE — Progress Notes (Signed)
? ?Established Patient Office Visit ? ?Subjective:  ?Patient ID: Wayne Weber, male    DOB: 1971-09-16  Age: 51 y.o. MRN: [id number] ? ?CC:  ?Chief Complaint  ?Patient presents with  ? Follow-up  ?  Pt states he needs a refill on his medication.   ? ? ?HPI ?Wayne Weber is a 51 y.o. male with past medical history of essential hypertension, hyperlipidemia, rectal bleeding, and insomnia presents for f/u of chronic medical conditions. Since his last visit, he has been going through a lot. He reports not taking his BP medications and caring for himself due to life's circumstances. He lost his brother on Sep 15, 2021, and his mother on December 2022. He reports increased stress with overseeing his brother's estate and his mother's death, which led him to resume smoking and drinking as a coping mechanism. He reported quitting smoking 2 years ago and will like to try to stop again. For the last months, he has been adherent with his BP medications and noted some positional dizziness with his BP meds. The patient will like medicine for erectile dysfunction. ? ?He states that he had his colonoscopy 1 year ago at [facility] when he had rectal bleeding. ? ?Today, he received his Tdap and the first dose of the Shingrix vaccine. ? ?Past Medical History:  ?Diagnosis Date  ? Contact dermatitis 09/03/2013  ? Hypertension   ? Muscle weakness (generalized) 11/23/2012  ? Pain in joint, shoulder region 11/23/2012  ? Rectal bleeding   ? ? ?Past Surgical History:  ?Procedure Laterality Date  ? ACHILLES TENDON REPAIR    ? ROTATOR CUFF REPAIR    ? TONSILLECTOMY    ? ? ?Family History  ?Problem Relation Age of Onset  ? Diabetes Mother   ? Hypertension Mother   ? Thyroid disease Mother   ? Diabetes Father   ? Heart disease Father   ? ? ?Social History  ? ?Socioeconomic History  ? Marital status: Single  ?  Spouse name: Not on file  ? Number of children: Not on file  ? Years of education: Not on file  ? Highest education level: Not on  file  ?Occupational History  ? Not on file  ?Tobacco Use  ? Smoking status: Former  ?  Packs/day: 1.00  ?  Years: 5.00  ?  Pack years: 5.00  ?  Types: Cigarettes  ?  Quit date: 09/15/2020  ?  Years since quitting: 1.5  ? Smokeless tobacco: Never  ?Vaping Use  ? Vaping Use: Never used  ?Substance and Sexual Activity  ? Alcohol use: Yes  ?  Comment: pabst blue ribbon, jack daniels- a fifth lasts 4 days  ? Drug use: No  ? Sexual activity: Yes  ?  Comment: multiple partners, not regularly using condoms  ?Other Topics Concern  ? Not on file  ?Social History Narrative  ? Not on file  ? ?Social Determinants of Health  ? ?Financial Resource Strain: Not on file  ?Food Insecurity: Not on file  ?Transportation Needs: Not on file  ?Physical Activity: Not on file  ?Stress: Not on file  ?Social Connections: Not on file  ?Intimate Partner Violence: Not on file  ? ? ?Outpatient Medications Prior to Visit  ?Medication Sig Dispense Refill  ? atorvastatin (LIPITOR) 80 MG tablet Take 1 tablet (80 mg total) by mouth daily. 90 tablet 3  ? carbamide peroxide (DEBROX) 6.5 % OTIC solution Place 5 drops into the right ear 2 (two) times daily. 15  mL 0  ? methylPREDNISolone (MEDROL DOSEPAK) 4 MG TBPK tablet Take as package instructions. 1 each 0  ? Multiple Vitamin (MULTIVITAMIN WITH MINERALS) TABS tablet Take 1 tablet by mouth daily.    ? Omega-3 Fatty Acids (FISH OIL PO) Take 1 capsule by mouth daily.    ? traZODone (DESYREL) 50 MG tablet TAKE 0.5-1 TABLETS BY MOUTH AT BEDTIME AS NEEDED FOR SLEEP. 90 tablet 2  ? amLODipine (NORVASC) 10 MG tablet TAKE 1 TABLET BY MOUTH EVERY DAY 90 tablet 1  ? ?No facility-administered medications prior to visit.  ? ? ?No Known Allergies ? ?ROS ?Review of Systems  ?Constitutional:  Negative for chills, fatigue and fever.  ?HENT:  Negative for rhinorrhea, sinus pressure, sinus pain and sore throat.   ?Eyes:  Negative for pain, discharge and itching.  ?Respiratory:  Negative for cough, chest tightness and  wheezing.   ?Cardiovascular:  Negative for chest pain and palpitations.  ?Gastrointestinal:  Negative for constipation, diarrhea, nausea and vomiting.  ?Endocrine: Negative for polydipsia, polyphagia and polyuria.  ?Genitourinary:  Negative for frequency, hematuria and urgency.  ?Musculoskeletal:  Negative for back pain, neck pain and neck stiffness.  ?Skin:  Negative for rash and wound.  ?Neurological:  Positive for dizziness. Negative for weakness and headaches.  ? ?  ?Objective:  ?  ?Physical Exam ?Constitutional:   ?   Appearance: Normal appearance.  ?HENT:  ?   Head: Normocephalic.  ?   Right Ear: External ear normal.  ?   Left Ear: External ear normal.  ?   Nose: Nose normal. No congestion or rhinorrhea.  ?   Mouth/Throat:  ?   Mouth: Mucous membranes are moist.  ?Eyes:  ?   General:     ?   Right eye: No discharge.     ?   Left eye: No discharge.  ?Cardiovascular:  ?   Rate and Rhythm: Normal rate and regular rhythm.  ?   Pulses: Normal pulses.  ?   Heart sounds: Normal heart sounds.  ?Pulmonary:  ?   Effort: Pulmonary effort is normal.  ?   Breath sounds: Normal breath sounds.  ?Abdominal:  ?   Palpations: Abdomen is soft.  ?Genitourinary: ?   Comments: deferred ?Musculoskeletal:  ?   Cervical back: Normal range of motion and neck supple.  ?   Right lower leg: No edema.  ?   Left lower leg: No edema.  ?Skin: ?   General: Skin is warm and dry.  ?Neurological:  ?   Mental Status: He is alert and oriented to person, place, and time.  ?Psychiatric:  ?   Comments: Normal affect  ? ? ?BP (!) 142/86 (BP Location: Right Arm, Patient Position: Sitting)   Pulse 97   Ht $R'6\' 1"'uL$  (1.854 m)   Wt 205 lb (93 kg)   SpO2 99%   BMI 27.05 kg/m?  ?Wt Readings from Last 3 Encounters:  ?04/01/22 205 lb (93 kg)  ?03/16/21 221 lb 6.4 oz (100.4 kg)  ?02/15/21 222 lb (100.7 kg)  ? ? ?Lab Results  ?Component Value Date  ? TSH 3.020 02/11/2021  ? ?Lab Results  ?Component Value Date  ? WBC 6.6 02/11/2021  ? HGB 14.6 02/11/2021  ? HCT  42.5 02/11/2021  ? MCV 88 02/11/2021  ? PLT 234 02/11/2021  ? ?Lab Results  ?Component Value Date  ? NA 140 02/11/2021  ? K 4.0 02/11/2021  ? CO2 19 (L) 02/11/2021  ? GLUCOSE 82 02/11/2021  ?  BUN 13 02/11/2021  ? CREATININE 1.07 02/11/2021  ? BILITOT 0.5 02/11/2021  ? ALKPHOS 73 02/11/2021  ? AST 67 (H) 02/11/2021  ? ALT 81 (H) 02/11/2021  ? PROT 7.3 02/11/2021  ? ALBUMIN 5.0 02/11/2021  ? CALCIUM 9.3 02/11/2021  ? EGFR 85 02/11/2021  ? ?Lab Results  ?Component Value Date  ? CHOL 298 (H) 02/11/2021  ? ?Lab Results  ?Component Value Date  ? HDL 54 02/11/2021  ? ?Lab Results  ?Component Value Date  ? LDLCALC 199 (H) 02/11/2021  ? ?Lab Results  ?Component Value Date  ? TRIG 232 (H) 02/11/2021  ? ?Lab Results  ?Component Value Date  ? CHOLHDL 3.9 05/20/2019  ? ?No results found for: HGBA1C ? ?  ?Assessment & Plan:  ? ?Problem List Items Addressed This Visit   ? ?  ? Cardiovascular and Mediastinum  ? Hypertension, essential  ?  -BP today is 142/86 ?-Pt recently resumed the regimen and didn't want the dose changed ?-Reports implementing lifestyle changes such as decreasing alcohol, and food high in salt intake, smoking cessation, and exercising ?-Mediation refilled with adequate education  ?Norvasc Education ?- In the first few days of therapy you may experience dizziness/light-headedness from your body adjusting to the blood pressure lowering. ?-watch for swelling of legs, ankles, or feet, and to report these symptoms to your provider. ?-Take at the same time each day, with or without food ? ?  ?  ? Relevant Medications  ? amLODipine (NORVASC) 10 MG tablet  ? sildenafil (VIAGRA) 100 MG tablet  ?  ? Other  ? Alcohol use  ?  -Drinks about 6-7 beers daily ?-Reports cutting back on his alcohol intake ?-Educated on the effects of alcohol on his BP ?-Verbalized understanding ? ?  ?  ? Current every day smoker  ?  Smokes about 2-3 cig day ? ?Asked about quitting: confirms that he/she currently smokes cigarettes ?Advise to quit  smoking: Educated about QUITTING to reduce the risk of cancer, cardio and cerebrovascular disease. ?Assess willingness: willing to quit at this time and is working on cutting back. ?Assist with counseling and pharm

## 2022-04-01 NOTE — Assessment & Plan Note (Signed)
-  BP today is 142/86 ?-Pt recently resumed the regimen and didn't want the dose changed ?-Reports implementing lifestyle changes such as decreasing alcohol, and food high in salt intake, smoking cessation, and exercising ?-Mediation refilled with adequate education  ?Norvasc Education ?- In the first few days of therapy you may experience dizziness/light-headedness from your body adjusting to the blood pressure lowering. ?-watch for swelling of legs, ankles, or feet, and to report these symptoms to your provider. ?-Take at the same time each day, with or without food ? ?

## 2022-04-01 NOTE — Assessment & Plan Note (Signed)
Smokes about 2-3 cig day ? ?Asked about quitting: confirms that he/she currently smokes cigarettes ?Advise to quit smoking: Educated about QUITTING to reduce the risk of cancer, cardio and cerebrovascular disease. ?Assess willingness: willing to quit at this time and is working on cutting back. ?Assist with counseling and pharmacotherapy: Counseled for 5 minutes and literature provided. ?Arrange for follow up: follow up in 3 months and continue to offer help. ?

## 2022-04-01 NOTE — Patient Instructions (Addendum)
I appreciate the opportunity to provide care to you today! ?  ?Viagra Education ? -Viagra is recommended to be taken 1 hour prior to sexual activity, however, may be taken 30 minutes to 4 hours prior to sexual activity. ?-May cause headache, dizziness (especially upon standing), nose bleed, upset stomach, flushing, or nasal congestion ?-May be taken with or without food, avoid taking with high-fat foods ?-Do not take >1 dose per day ? ?Norvasc Education ?- In the first few days of therapy you may experience dizziness/light-headedness from your body adjusting to the blood pressure lowering. ?-watch for swelling of legs, ankles, or feet, and to report these symptoms to your provider. ?-Take at the same time each day, with or without food ? ?-Increasing potassium intake can help decrease your blood pressure. ? ?-Please stop by the labs to have your blood drawn ?-Please continue to work on decreasing your alcohole and smoking intake ? ? ?It was a pleasure to see you and I look forward to continuing to work together on your health and well-being. ?Please do not hesitate to call the office if you need care or have questions about your care. ?  ?Have a wonderful day and week. ?With Gratitude, ?Gilmore Laroche MSN, FNP-BC ? ?

## 2022-04-01 NOTE — Assessment & Plan Note (Signed)
-  Viagra ordered with proper education ?Viagra Education ? -Viagra is recommended to be taken 1 hour prior to sexual activity, however, may be taken 30 minutes to 4 hours prior to sexual activity. ?-May cause headache, dizziness (especially upon standing), nose bleed, upset stomach, flushing, or nasal congestion ?-May be taken with or without food, avoid taking with high-fat foods ?-Do not take >1 dose per day ?- ?

## 2022-04-01 NOTE — Assessment & Plan Note (Signed)
-  Drinks about 6-7 beers daily ?-Reports cutting back on his alcohol intake ?-Educated on the effects of alcohol on his BP ?-Verbalized understanding ?

## 2022-04-02 LAB — CMP14+EGFR
ALT: 43 IU/L (ref 0–44)
AST: 30 IU/L (ref 0–40)
Albumin/Globulin Ratio: 1.9 (ref 1.2–2.2)
Albumin: 4.8 g/dL (ref 3.8–4.9)
Alkaline Phosphatase: 94 IU/L (ref 44–121)
BUN/Creatinine Ratio: 13 (ref 9–20)
BUN: 14 mg/dL (ref 6–24)
Bilirubin Total: 0.4 mg/dL (ref 0.0–1.2)
CO2: 20 mmol/L (ref 20–29)
Calcium: 9.5 mg/dL (ref 8.7–10.2)
Chloride: 103 mmol/L (ref 96–106)
Creatinine, Ser: 1.06 mg/dL (ref 0.76–1.27)
Globulin, Total: 2.5 g/dL (ref 1.5–4.5)
Glucose: 85 mg/dL (ref 70–99)
Potassium: 4.6 mmol/L (ref 3.5–5.2)
Sodium: 141 mmol/L (ref 134–144)
Total Protein: 7.3 g/dL (ref 6.0–8.5)
eGFR: 85 mL/min/{1.73_m2} (ref >59)

## 2022-04-02 LAB — CBC WITH DIFFERENTIAL/PLATELET
Basophils Absolute: 0.1 10*3/uL (ref 0.0–0.2)
Basos: 1 %
EOS (ABSOLUTE): 0.5 10*3/uL — ABNORMAL HIGH (ref 0.0–0.4)
Eos: 7 %
Hematocrit: 49.5 % (ref 37.5–51.0)
Hemoglobin: 17.3 g/dL (ref 13.0–17.7)
Immature Grans (Abs): 0 10*3/uL (ref 0.0–0.1)
Immature Granulocytes: 0 %
Lymphocytes Absolute: 2.2 10*3/uL (ref 0.7–3.1)
Lymphs: 32 %
MCH: 31.7 pg (ref 26.6–33.0)
MCHC: 34.9 g/dL (ref 31.5–35.7)
MCV: 91 fL (ref 79–97)
Monocytes Absolute: 0.7 10*3/uL (ref 0.1–0.9)
Monocytes: 10 %
Neutrophils Absolute: 3.5 10*3/uL (ref 1.4–7.0)
Neutrophils: 50 %
Platelets: 246 10*3/uL (ref 150–450)
RBC: 5.46 x10E6/uL (ref 4.14–5.80)
RDW: 13.7 % (ref 11.6–15.4)
WBC: 6.9 10*3/uL (ref 3.4–10.8)

## 2022-04-02 LAB — TSH+FREE T4
Free T4: 0.89 ng/dL (ref 0.82–1.77)
TSH: 1.5 u[IU]/mL (ref 0.450–4.500)

## 2022-04-02 LAB — HEMOGLOBIN A1C
Est. average glucose Bld gHb Est-mCnc: 114 mg/dL
Hgb A1c MFr Bld: 5.6 % (ref 4.8–5.6)

## 2022-04-02 LAB — LIPID PANEL
Chol/HDL Ratio: 5.5 ratio — ABNORMAL HIGH (ref 0.0–5.0)
Cholesterol, Total: 295 mg/dL — ABNORMAL HIGH (ref 100–199)
HDL: 54 mg/dL (ref >39)
LDL Chol Calc (NIH): 196 mg/dL — ABNORMAL HIGH (ref 0–99)
Triglycerides: 232 mg/dL — ABNORMAL HIGH (ref 0–149)
VLDL Cholesterol Cal: 45 mg/dL — ABNORMAL HIGH (ref 5–40)

## 2022-04-02 LAB — VITAMIN D 25 HYDROXY (VIT D DEFICIENCY, FRACTURES): Vit D, 25-Hydroxy: 9.9 ng/mL — ABNORMAL LOW (ref 30.0–100.0)

## 2022-04-04 ENCOUNTER — Other Ambulatory Visit: Payer: Self-pay | Admitting: Family Medicine

## 2022-04-04 DIAGNOSIS — E559 Vitamin D deficiency, unspecified: Secondary | ICD-10-CM

## 2022-04-04 DIAGNOSIS — E782 Mixed hyperlipidemia: Secondary | ICD-10-CM

## 2022-04-04 DIAGNOSIS — E785 Hyperlipidemia, unspecified: Secondary | ICD-10-CM

## 2022-04-04 MED ORDER — VITAMIN D (ERGOCALCIFEROL) 1.25 MG (50000 UNIT) PO CAPS: 50000.0000 [IU] | ORAL_CAPSULE | ORAL | 1 refills | Status: DC

## 2022-04-04 MED ORDER — ATORVASTATIN CALCIUM 80 MG PO TABS: 80.0000 mg | ORAL_TABLET | Freq: Every day | ORAL | 3 refills | Status: DC

## 2022-04-04 NOTE — Progress Notes (Unsigned)
The 10-year ASCVD risk score (Arnett DK, et al., 2019) is: 12.1% ?  Values used to calculate the score: ?    Age: 51 years ?    Sex: Male ?    Is Non-Hispanic African American: Yes ?    Diabetic: No ?    Tobacco smoker: No ?    Systolic Blood Pressure: 142 mmHg ?    Is BP treated: Yes ?    HDL Cholesterol: 54 mg/dL ?    Total Cholesterol: 295 mg/dL  ?

## 2022-04-04 NOTE — Progress Notes (Signed)
Please advise the patient that his cholesterol is elevated. I've sent a refill of his atorvastatin and a prescription for Vit D supplement because his Vit D is low. I recommend OTC Fish oil supplement to lower his triglyceride. He can take 2000mg (2 tabs)  BID. It is usually 1000mg  OTC so he can take 2 tabs twice daily. I recommend a diet low in total fat or saturated fat.

## 2022-07-01 ENCOUNTER — Ambulatory Visit: Payer: Managed Care, Other (non HMO) | Admitting: Family Medicine

## 2022-07-02 ENCOUNTER — Encounter: Payer: Self-pay | Admitting: Emergency Medicine

## 2022-07-02 ENCOUNTER — Ambulatory Visit
Admission: EM | Admit: 2022-07-02 | Discharge: 2022-07-02 | Disposition: A | Discharge status: Home / Self Care | Payer: Self-pay | Attending: Nurse Practitioner | Admitting: Nurse Practitioner

## 2022-07-02 DIAGNOSIS — M79671 Pain in right foot: Secondary | ICD-10-CM

## 2022-07-02 DIAGNOSIS — M7661 Achilles tendinitis, right leg: Secondary | ICD-10-CM

## 2022-07-02 MED ORDER — PREDNISONE 50 MG PO TABS: ORAL_TABLET | ORAL | 0 refills | Status: DC

## 2022-07-02 MED ORDER — OXYCODONE-ACETAMINOPHEN 5-325 MG PO TABS: 1.0000 | ORAL_TABLET | Freq: Three times a day (TID) | ORAL | 0 refills | Status: DC | PRN

## 2022-07-02 NOTE — Discharge Instructions (Addendum)
Take medication as prescribed. Recommend purchasing an Ace wrap over-the-counter to wrap the foot and ankle to provide compression and support. Apply ice to the affected area to help with pain and swelling.  Apply for 20 minutes, remove for 1 hour, then repeat. RICE therapy, rest, ice, compression and elevation. If symptoms do not improve, please follow-up with orthopedics for further evaluation.

## 2022-07-02 NOTE — ED Provider Notes (Signed)
[facility]    CSN: [id number] Arrival date & time: 07/02/22  0931      History   Chief Complaint No chief complaint on file.   HPI Wayne Weber is a 51 y.o. male.   The history is provided by the patient.   Patient presents for pain in the right foot and lower leg that been present for the past week.  Patient states area has become swollen and difficult to walk on.  He reports prior history of right Achilles tendon repair.  States that he normally has flares that cause the same or similar symptoms.  He denies fever, chills, fatigue, numbness, tingling, inability to bear weight. Patient has not taken any medication or used any medication for his symptoms.  Patient denies prior history of gout or diabetes.  States that when he was seen previously at another urgent care, he was prescribed prednisone and pain medication.  Patient declines x-ray or lab work today.  Past Medical History:  Diagnosis Date   Contact dermatitis 09/03/2013   Hypertension    Muscle weakness (generalized) 11/23/2012   Pain in joint, shoulder region 11/23/2012   Rectal bleeding     Patient Active Problem List   Diagnosis Date Noted   Erectile dysfunction 04/01/2022   Cerumen impaction 02/15/2021   Screening due 02/01/2021   Encounter to establish care 02/01/2021   Hypertension, essential 02/01/2021   Insomnia 02/01/2021   Rectal bleeding 06/04/2019   Current every day smoker 09/08/2018   Major depressive disorder with current active episode 09/08/2018   Alcohol use 07/10/2018   Hyperlipidemia 07/10/2018   Status post rotator cuff repair 11/23/2012    Past Surgical History:  Procedure Laterality Date   ACHILLES TENDON REPAIR     ROTATOR CUFF REPAIR     TONSILLECTOMY         Home Medications    Prior to Admission medications   Medication Sig Start Date End Date Taking? Authorizing Provider  amLODipine (NORVASC) 10 MG tablet Take 1 tablet (10 mg total) by mouth daily.  04/01/22   Gilmore Laroche, FNP  atorvastatin (LIPITOR) 80 MG tablet Take 1 tablet (80 mg total) by mouth daily. 04/04/22   Gilmore Laroche, FNP  carbamide peroxide (DEBROX) 6.5 % OTIC solution Place 5 drops into the right ear 2 (two) times daily. 02/15/21   Heather Roberts, NP  methylPREDNISolone (MEDROL DOSEPAK) 4 MG TBPK tablet Take as package instructions. 03/16/21   Anabel Halon, MD  Multiple Vitamin (MULTIVITAMIN WITH MINERALS) TABS tablet Take 1 tablet by mouth daily.    [provider]  Omega-3 Fatty Acids (FISH OIL PO) Take 1 capsule by mouth daily.    [provider]  sildenafil (VIAGRA) 100 MG tablet Take 0.5 tablets (50 mg total) by mouth daily as needed for erectile dysfunction. 04/01/22   Gilmore Laroche, FNP  traZODone (DESYREL) 50 MG tablet TAKE 0.5-1 TABLETS BY MOUTH AT BEDTIME AS NEEDED FOR SLEEP. 06/28/21   Heather Roberts, NP  Vitamin D, Ergocalciferol, (DRISDOL) 1.25 MG (50000 UNIT) CAPS capsule Take 1 capsule (50,000 Units total) by mouth every 7 (seven) days. 04/04/22   Gilmore Laroche, FNP    Family History Family History  Problem Relation Age of Onset   Diabetes Mother    Hypertension Mother    Thyroid disease Mother    Diabetes Father    Heart disease Father     Social History Social History   Tobacco Use   Smoking status:  Former    Packs/day: 1.00    Years: 5.00    Total pack years: 5.00    Types: Cigarettes    Quit date: 09/15/2020    Years since quitting: 1.7   Smokeless tobacco: Never  Vaping Use   Vaping Use: Never used  Substance Use Topics   Alcohol use: Yes    Comment: pabst blue ribbon, jack daniels- a fifth lasts 4 days   Drug use: No     Allergies   Patient has no known allergies.   Review of Systems Review of Systems Per HPI  Physical Exam Triage Vital Signs ED Triage Vitals  Enc Vitals Group     BP 07/02/22 1051 130/81     Pulse Rate 07/02/22 1051 75     Resp 07/02/22 1051 18     Temp 07/02/22 1051 98.2 F (36.8  C)     Temp Source 07/02/22 1051 Oral     SpO2 07/02/22 1051 98 %     Weight --      Height --      Head Circumference --      Peak Flow --      Pain Score 07/02/22 1052 10     Pain Loc --      Pain Edu? --      Excl. in GC? --    No data found.  Updated Vital Signs BP 130/81 (BP Location: Right Arm)   Pulse 75   Temp 98.2 F (36.8 C) (Oral)   Resp 18   SpO2 98%   Visual Acuity Right Eye Distance:   Left Eye Distance:   Bilateral Distance:    Right Eye Near:   Left Eye Near:    Bilateral Near:     Physical Exam Vitals and nursing note reviewed.  Constitutional:      General: He is not in acute distress.    Appearance: Normal appearance.  HENT:     Mouth/Throat:     Mouth: Mucous membranes are moist.  Eyes:     Extraocular Movements: Extraocular movements intact.     Pupils: Pupils are equal, round, and reactive to light.  Pulmonary:     Effort: Pulmonary effort is normal.  Musculoskeletal:     Right ankle: Swelling present. Tenderness present. Decreased range of motion.     Right Achilles Tendon: Tenderness present.     Right foot: Swelling present.     Comments: Tenderness, warmth, and swelling to the right Achilles tendon.  Previous surgical scarring present.  Induration is present immediately under the scar.  Skin:    General: Skin is warm and dry.  Neurological:     General: No focal deficit present.     Mental Status: He is alert and oriented to person, place, and time.  Psychiatric:        Mood and Affect: Mood normal.        Behavior: Behavior normal.      UC Treatments / Results  Labs (all labs ordered are listed, but only abnormal results are displayed) Labs Reviewed - No data to display  EKG   Radiology No results found.  Procedures Procedures (including critical care time)  Medications Ordered in UC Medications - No data to display  Initial Impression / Assessment and Plan / UC Course  I have reviewed the triage vital signs  and the nursing notes.  Pertinent labs & imaging results that were available during my care of the patient were reviewed by me  and considered in my medical decision making (see chart for details).  Patient presents with swelling, pain, and redness to the right Achilles tendon.  Symptoms have been present for the past week.  On exam, area to the right Achilles tendon is warm and tender.  There is an induration immediately under the surgical scar.  Patient declines lab work or imaging today.  Cannot rule out gout versus cellulitis versus inflammation.  We will start patient on prednisone for his symptoms along with oxycodone/Tylenol to help for pain.  Supportive care recommendations were provided to the patient.  Patient advised to follow-up with orthopedics if his symptoms do not improve. Final Clinical Impressions(s) / UC Diagnoses   Final diagnoses:  None   Discharge Instructions   None    ED Prescriptions   None    PDMP not reviewed this encounter.   Abran Cantor, NP 07/02/22 1124

## 2022-07-02 NOTE — ED Triage Notes (Signed)
Pain on back side of right foot and lower leg x 1 week.  States area is swollen swollen and painful.

## 2022-07-20 ENCOUNTER — Encounter: Payer: Self-pay | Admitting: Family Medicine

## 2022-07-20 ENCOUNTER — Ambulatory Visit: Payer: BC Managed Care – PPO | Admitting: Family Medicine

## 2022-07-20 DIAGNOSIS — M79671 Pain in right foot: Secondary | ICD-10-CM

## 2022-07-20 MED ORDER — OXYCODONE-ACETAMINOPHEN 5-325 MG PO TABS: 1.0000 | ORAL_TABLET | Freq: Three times a day (TID) | ORAL | 0 refills | Status: AC | PRN

## 2022-07-20 MED ORDER — PREDNISONE 50 MG PO TABS: ORAL_TABLET | ORAL | 0 refills | Status: DC

## 2022-07-20 NOTE — Progress Notes (Unsigned)
Virtual Visit via Telephone Note   This visit type was conducted due to national recommendations for restrictions regarding the COVID-19 Pandemic (e.g. social distancing) in an effort to limit this patient's exposure and mitigate transmission in our community.  Due to his co-morbid illnesses, this patient is at least at moderate risk for complications without adequate follow up.  This format is felt to be most appropriate for this patient at this time.  The patient did not have access to video technology/had technical difficulties with video requiring transitioning to audio format only (telephone).  All issues noted in this document were discussed and addressed.  No physical exam could be performed with this format.  Please refer to the patient's chart for his  consent to telehealth for [facility].   Evaluation Performed:  Follow-up visit  Date:  07/20/2022   ID:  Wayne Weber, DOB 1971/07/02, MRN [id number]  Patient Location: Home Provider Location: Office/Clinic  Participants: Patient*** Location of Patient: Home Location of Provider: Telehealth Consent was obtain for visit to be over via telehealth. I verified that I am speaking with the correct person using two identifiers.  PCP:  Gilmore Laroche, FNP   Chief Complaint:  ***  History of Present Illness:    Wayne Weber is a 51 y.o. male with ***  The patient does not have symptoms concerning for COVID-19 infection (fever, chills, cough, or new shortness of breath).   Past Medical, Surgical, Social History, Allergies, and Medications have been Reviewed.  Past Medical History:  Diagnosis Date   Contact dermatitis 09/03/2013   Hypertension    Muscle weakness (generalized) 11/23/2012   Pain in joint, shoulder region 11/23/2012   Rectal bleeding    Past Surgical History:  Procedure Laterality Date   ACHILLES TENDON REPAIR     ROTATOR CUFF REPAIR     TONSILLECTOMY       Current Meds  Medication Sig    amLODipine (NORVASC) 10 MG tablet Take 1 tablet (10 mg total) by mouth daily.   carbamide peroxide (DEBROX) 6.5 % OTIC solution Place 5 drops into the right ear 2 (two) times daily.   methylPREDNISolone (MEDROL DOSEPAK) 4 MG TBPK tablet Take as package instructions.   Multiple Vitamin (MULTIVITAMIN WITH MINERALS) TABS tablet Take 1 tablet by mouth daily.   Omega-3 Fatty Acids (FISH OIL PO) Take 1 capsule by mouth daily.   sildenafil (VIAGRA) 100 MG tablet Take 0.5 tablets (50 mg total) by mouth daily as needed for erectile dysfunction.   traZODone (DESYREL) 50 MG tablet TAKE 0.5-1 TABLETS BY MOUTH AT BEDTIME AS NEEDED FOR SLEEP.   Vitamin D, Ergocalciferol, (DRISDOL) 1.25 MG (50000 UNIT) CAPS capsule Take 1 capsule (50,000 Units total) by mouth every 7 (seven) days.     Allergies:   Patient has no known allergies.   ROS:   Please see the history of present illness.    *** All other systems reviewed and are negative.   Labs/Other Tests and Data Reviewed:    Recent Labs: 04/01/2022: ALT 43; BUN 14; Creatinine, Ser 1.06; Hemoglobin 17.3; Platelets 246; Potassium 4.6; Sodium 141; TSH 1.500   Recent Lipid Panel Lab Results  Component Value Date/Time   CHOL 295 (H) 04/01/2022 08:53 AM   TRIG 232 (H) 04/01/2022 08:53 AM   HDL 54 04/01/2022 08:53 AM   CHOLHDL 5.5 (H) 04/01/2022 08:53 AM   CHOLHDL 3.9 05/20/2019 09:45 AM   LDLCALC 196 (H) 04/01/2022 08:53 AM   LDLCALC 168 (  H) 05/20/2019 09:45 AM    Wt Readings from Last 3 Encounters:  04/01/22 205 lb (93 kg)  03/16/21 221 lb 6.4 oz (100.4 kg)  02/15/21 222 lb (100.7 kg)     Objective:    Vital Signs:  There were no vitals taken for this visit.   {[facility] Virtual Exam (Optional):[phone]::"VITAL SIGNS:  reviewed"}  ASSESSMENT & PLAN:     Time:   Today, I have spent *** minutes reviewing the chart, including problem list, medications, and with the patient with telehealth technology discussing the above  problems.   Medication Adjustments/Labs and Tests Ordered: Current medicines are reviewed at length with the patient today.  Concerns regarding medicines are outlined above.   Tests Ordered: No orders of the defined types were placed in this encounter.   Medication Changes: No orders of the defined types were placed in this encounter.    Note: This dictation was prepared with Dragon dictation along with smaller phrase technology. Similar sounding words can be transcribed inadequately or may not be corrected upon review. Any transcriptional errors that result from this process are unintentional.      Disposition:  Follow up  Signed, Gilmore Laroche, FNP  07/20/2022 4:44 PM     [facility]

## 2022-07-25 ENCOUNTER — Other Ambulatory Visit: Payer: Self-pay

## 2022-07-25 ENCOUNTER — Telehealth: Payer: Self-pay | Admitting: Family Medicine

## 2022-07-25 DIAGNOSIS — I10 Essential (primary) hypertension: Secondary | ICD-10-CM

## 2022-07-25 MED ORDER — AMLODIPINE BESYLATE 10 MG PO TABS: 10.0000 mg | ORAL_TABLET | Freq: Every day | ORAL | 1 refills | Status: DC

## 2022-07-25 NOTE — Telephone Encounter (Signed)
Rx sent 

## 2022-07-25 NOTE — Telephone Encounter (Signed)
Pt called stating that he picked up his meds and was missing amLODipine (NORVASC) 10 MG tablet. Wants to know if you can please send?    [facility]

## 2023-05-29 ENCOUNTER — Other Ambulatory Visit: Payer: Self-pay

## 2023-05-29 ENCOUNTER — Telehealth: Payer: Self-pay | Admitting: Family Medicine

## 2023-05-29 DIAGNOSIS — I10 Essential (primary) hypertension: Secondary | ICD-10-CM

## 2023-05-29 MED ORDER — AMLODIPINE BESYLATE 10 MG PO TABS: 10.0000 mg | ORAL_TABLET | Freq: Every day | ORAL | 1 refills | Status: DC

## 2023-05-29 NOTE — Telephone Encounter (Signed)
Prescription Request  05/29/2023  LOV: 07/20/2022  What is the name of the medication or equipment? amLODipine (NORVASC) 10 MG tablet   Have you contacted your pharmacy to request a refill? Yes   Which pharmacy would you like this sent to?   [id number] - [place], [place] - [address] [place] [address] Phone: [phone] Fax: [phone]    Patient notified that their request is being sent to the clinical staff for review and that they should receive a response within 2 business days.   Please advise at [facility] [phone]    Patient wants to change pharm to [facility] scales [address]

## 2023-05-29 NOTE — Telephone Encounter (Signed)
Spoke with pt, let him know a refill would be sent but he needs to f/u for an appt asap, he asked if there is any cancellation to please contact him this week, his insurance will be cancelled soon. Can you please schedule him if there is anything open this week.?

## 2023-05-30 NOTE — Telephone Encounter (Signed)
Appt scheduled pt aware  

## 2023-06-12 ENCOUNTER — Ambulatory Visit: Payer: BC Managed Care – PPO | Admitting: Family Medicine

## 2023-07-12 ENCOUNTER — Encounter: Payer: Self-pay | Admitting: Family Medicine

## 2023-07-12 ENCOUNTER — Ambulatory Visit: Payer: Managed Care, Other (non HMO) | Admitting: Family Medicine

## 2023-07-12 VITALS — BP 152/90 | HR 85 | Ht 74.0 in | Wt 215.1 lb

## 2023-07-12 DIAGNOSIS — H6121 Impacted cerumen, right ear: Secondary | ICD-10-CM

## 2023-07-12 DIAGNOSIS — R0602 Shortness of breath: Secondary | ICD-10-CM | POA: Diagnosis not present

## 2023-07-12 DIAGNOSIS — Z23 Encounter for immunization: Secondary | ICD-10-CM

## 2023-07-12 DIAGNOSIS — E038 Other specified hypothyroidism: Secondary | ICD-10-CM

## 2023-07-12 DIAGNOSIS — M79671 Pain in right foot: Secondary | ICD-10-CM | POA: Diagnosis not present

## 2023-07-12 DIAGNOSIS — I10 Essential (primary) hypertension: Secondary | ICD-10-CM | POA: Diagnosis not present

## 2023-07-12 DIAGNOSIS — E559 Vitamin D deficiency, unspecified: Secondary | ICD-10-CM

## 2023-07-12 DIAGNOSIS — E7849 Other hyperlipidemia: Secondary | ICD-10-CM

## 2023-07-12 DIAGNOSIS — R7301 Impaired fasting glucose: Secondary | ICD-10-CM

## 2023-07-12 MED ORDER — AMLODIPINE BESYLATE 10 MG PO TABS: 10.0000 mg | ORAL_TABLET | Freq: Every day | ORAL | 1 refills | Status: DC

## 2023-07-12 MED ORDER — PREDNISONE 50 MG PO TABS: ORAL_TABLET | ORAL | 0 refills | Status: DC

## 2023-07-12 NOTE — Patient Instructions (Addendum)
I appreciate the opportunity to provide care to you today!    Follow up:  1 month   Labs: please stop by the lab during the week to get your blood drawn (CBC, CMP, TSH, Lipid profile, HgA1c, Vit D)  OTC fish oil 2000 mg BID for cholesterol -check your BP daily and bring your reading log with you at your next appointment -I want your BP <140/90 -Aim to be active at least 5 days a week for 30 minutes each day(brisk walking)    Attached with your AVS, you will find valuable resources for self-education. I highly recommend dedicating some time to thoroughly examine them.   Please continue to a heart-healthy diet and increase your physical activities. Try to exercise for at least five days a week.    It was a pleasure to see you and I look forward to continuing to work together on your health and well-being. Please do not hesitate to call the office if you need care or have questions about your care.  In case of emergency, please visit the Emergency Department for urgent care, or contact our clinic at [phone] to schedule an appointment. We're here to help you!   Have a wonderful day and week. With Gratitude, Gilmore Laroche MSN, FNP-BC

## 2023-07-12 NOTE — Progress Notes (Addendum)
Established Patient Office Visit  Subjective:  Patient ID: Wayne Weber, male    DOB: [birth year]  Age: 52 y.o. MRN: [id number]  CC:  Chief Complaint  Patient presents with   Care Management    Follow up appt, running out of htn medication needs a refill. Reports pain on his achilles tendon, would like prednisone to have for when he has a flare up.    Shortness of Breath    Pt reports after receiving moderna he feels sob, d/c smoking to see if it was that but sob still present.    HPI Wayne Weber is a 52 y.o. male with past medical history of essential hypertension and pain of the right heel, presents for f/u of  chronic medical conditions. For the details of today's visit, please refer to the assessment and plan.     Past Medical History:  Diagnosis Date   Contact dermatitis 09/03/2013   Hypertension    Muscle weakness (generalized) 11/23/2012   Pain in joint, shoulder region 11/23/2012   Rectal bleeding     Past Surgical History:  Procedure Laterality Date   ACHILLES TENDON REPAIR     ROTATOR CUFF REPAIR     TONSILLECTOMY      Family History  Problem Relation Age of Onset   Diabetes Mother    Hypertension Mother    Thyroid disease Mother    Diabetes Father    Heart disease Father     Social History   Socioeconomic History   Marital status: Single    Spouse name: Not on file   Number of children: Not on file   Years of education: Not on file   Highest education level: Not on file  Occupational History   Not on file  Tobacco Use   Smoking status: Former    Current packs/day: 0.00    Average packs/day: 1 pack/day for 5.0 years (5.0 ttl pk-yrs)    Types: Cigarettes    Start date: 09/16/2015    Quit date: 09/15/2020    Years since quitting: 2.9   Smokeless tobacco: Never  Vaping Use   Vaping status: Never Used  Substance and Sexual Activity   Alcohol use: Yes    Comment: pabst blue ribbon, jack daniels- a fifth lasts 4 days   Drug use: No    Sexual activity: Yes    Comment: multiple partners, not regularly using condoms  Other Topics Concern   Not on file  Social History Narrative   Not on file   Social Determinants of Health   Financial Resource Strain: Not on file  Food Insecurity: Not on file  Transportation Needs: Not on file  Physical Activity: Sufficiently Active (09/08/2018)   Exercise Vital Sign    Days of Exercise per Week: 5 days    Minutes of Exercise per Session: 30 min  Stress: Not on file  Social Connections: Not on file  Intimate Partner Violence: Not on file    Outpatient Medications Prior to Visit  Medication Sig Dispense Refill   atorvastatin (LIPITOR) 80 MG tablet Take 1 tablet (80 mg total) by mouth daily. 90 tablet 3   carbamide peroxide (DEBROX) 6.5 % OTIC solution Place 5 drops into the right ear 2 (two) times daily. 15 mL 0   Multiple Vitamin (MULTIVITAMIN WITH MINERALS) TABS tablet Take 1 tablet by mouth daily.     Omega-3 Fatty Acids (FISH OIL PO) Take 1 capsule by mouth daily.     sildenafil (VIAGRA) 100  MG tablet Take 0.5 tablets (50 mg total) by mouth daily as needed for erectile dysfunction. 5 tablet 11   traZODone (DESYREL) 50 MG tablet TAKE 0.5-1 TABLETS BY MOUTH AT BEDTIME AS NEEDED FOR SLEEP. 90 tablet 2   Vitamin D, Ergocalciferol, (DRISDOL) 1.25 MG (50000 UNIT) CAPS capsule Take 1 capsule (50,000 Units total) by mouth every 7 (seven) days. 5 capsule 1   amLODipine (NORVASC) 10 MG tablet Take 1 tablet (10 mg total) by mouth daily. 90 tablet 1   predniSONE (DELTASONE) 50 MG tablet Take 1 tablet daily with breakfast for 5 days. 5 tablet 0   No facility-administered medications prior to visit.    No Known Allergies  ROS Review of Systems  Constitutional:  Negative for fatigue and fever.  Eyes:  Negative for visual disturbance.  Respiratory:  Positive for shortness of breath. Negative for cough, choking, chest tightness, wheezing and stridor.   Cardiovascular:  Negative for chest  pain and palpitations.  Genitourinary:  Negative for genital sores.  Neurological:  Negative for dizziness and headaches.      Objective:    Physical Exam HENT:     Head: Normocephalic.     Right Ear: External ear normal. There is impacted cerumen (right ear).     Left Ear: External ear normal.     Nose: No congestion or rhinorrhea.     Mouth/Throat:     Mouth: Mucous membranes are moist.  Cardiovascular:     Rate and Rhythm: Regular rhythm.     Heart sounds: No murmur heard. Pulmonary:     Effort: No respiratory distress.     Breath sounds: Normal breath sounds.  Neurological:     Mental Status: He is alert.     BP (!) 152/90 (BP Location: Left Arm)   Pulse 85   Ht 6\' 2"  (1.88 m)   Wt 215 lb 1.3 oz (97.6 kg)   SpO2 96%   BMI 27.61 kg/m  Wt Readings from Last 3 Encounters:  07/12/23 215 lb 1.3 oz (97.6 kg)  04/01/22 205 lb (93 kg)  03/16/21 221 lb 6.4 oz (100.4 kg)    Lab Results  Component Value Date   TSH 1.500 04/01/2022   Lab Results  Component Value Date   WBC 6.9 04/01/2022   HGB 17.3 04/01/2022   HCT 49.5 04/01/2022   MCV 91 04/01/2022   PLT 246 04/01/2022   Lab Results  Component Value Date   NA 141 04/01/2022   K 4.6 04/01/2022   CO2 20 04/01/2022   GLUCOSE 85 04/01/2022   BUN 14 04/01/2022   CREATININE 1.06 04/01/2022   BILITOT 0.4 04/01/2022   ALKPHOS 94 04/01/2022   AST 30 04/01/2022   ALT 43 04/01/2022   PROT 7.3 04/01/2022   ALBUMIN 4.8 04/01/2022   CALCIUM 9.5 04/01/2022   EGFR 85 04/01/2022   Lab Results  Component Value Date   CHOL 295 (H) 04/01/2022   Lab Results  Component Value Date   HDL 54 04/01/2022   Lab Results  Component Value Date   LDLCALC 196 (H) 04/01/2022   Lab Results  Component Value Date   TRIG 232 (H) 04/01/2022   Lab Results  Component Value Date   CHOLHDL 5.5 (H) 04/01/2022   Lab Results  Component Value Date   HGBA1C 5.6 04/01/2022      Assessment & Plan:  Hypertension,  essential Assessment & Plan:  Uncontrolled, likely due to increased stress as reported by the patient. The  patient is adhering to his blood pressure medication regimen. Asymptomatic during today's clinic visit. The patient reports experiencing significant stress. Encouraged to follow a low-sodium diet and increase physical activity. No changes were made to the treatment regimen today. Advised to resume therapy and continue taking amlodipine 10 mg daily as prescribed. Follow-up appointment is scheduled in 4 weeks. BP Readings from Last 3 Encounters:  07/12/23 (!) 152/90  07/02/22 130/81  04/01/22 (!) 142/86         Orders: -     amLODIPine Besylate; Take 1 tablet (10 mg total) by mouth daily.  Dispense: 90 tablet; Refill: 1  Pain of right heel Assessment & Plan: The patient reports having undergone right Achilles tendon surgery a few months ago and has followed up with his surgeon. He now experiences occasional pain in the right Achilles tendon with increased exertion.  A prescription for prednisone 50 mg daily for 5 days will be provided. In addition, the patient is encouraged to take Tylenol as needed for pain relief and to avoid activities that may exacerbate the condition, such as running, jumping, or prolonged standing  Orders: -     predniSONE; Take 1 tablet daily with breakfast for 5 days.  Dispense: 5 tablet; Refill: 0  SOB (shortness of breath) Assessment & Plan: Reports having his moderna vaccination a years ago   And since, has been having occasional episodes of gasping for air No chest tightness, palpitation, orthopnea, diaphoresis and lower extremity edema No history of asthma, or anxiety No history of MI or TIA Will place a referral to cardiology for evaluation  Orders: -     Ambulatory referral to Cardiology  Impacted cerumen of right ear Assessment & Plan: Ear irrigation performed   Immunization due  IFG (impaired fasting glucose) -     Hemoglobin  A1c  Vitamin D deficiency -     VITAMIN D 25 Hydroxy (Vit-D Deficiency, Fractures)  Other specified hypothyroidism -     TSH + free T4  Other hyperlipidemia -     Lipid panel -     CMP14+EGFR -     CBC with Differential/Platelet  Note: This chart has been completed using Engineer, civil (consulting) software, and while attempts have been made to ensure accuracy, certain words and phrases may not be transcribed as intended.    Follow-up: Return in about 1 month (around 08/12/2023).   Gilmore Laroche, FNP

## 2023-07-13 ENCOUNTER — Ambulatory Visit: Payer: Managed Care, Other (non HMO)

## 2023-07-14 ENCOUNTER — Telehealth: Payer: Self-pay | Admitting: Family Medicine

## 2023-07-14 DIAGNOSIS — M79671 Pain in right foot: Secondary | ICD-10-CM | POA: Insufficient documentation

## 2023-07-14 DIAGNOSIS — R0602 Shortness of breath: Secondary | ICD-10-CM | POA: Insufficient documentation

## 2023-07-14 NOTE — Assessment & Plan Note (Addendum)
Reports having his moderna vaccination a years ago   And since, has been having occasional episodes of gasping for air No chest tightness, palpitation, orthopnea, diaphoresis and lower extremity edema No history of asthma, or anxiety No history of MI or TIA Will place a referral to cardiology for evaluation

## 2023-07-14 NOTE — Telephone Encounter (Signed)
Left a detailed vm for pt. 

## 2023-07-14 NOTE — Telephone Encounter (Signed)
I have removed it from chart, you should be able to sign off.

## 2023-07-14 NOTE — Assessment & Plan Note (Addendum)
Uncontrolled, likely due to increased stress as reported by the patient. The patient is adhering to his blood pressure medication regimen. Asymptomatic during today's clinic visit. The patient reports experiencing significant stress. Encouraged to follow a low-sodium diet and increase physical activity. No changes were made to the treatment regimen today. Advised to resume therapy and continue taking amlodipine 10 mg daily as prescribed. Follow-up appointment is scheduled in 4 weeks. BP Readings from Last 3 Encounters:  07/12/23 (!) 152/90  07/02/22 130/81  04/01/22 (!) 142/86

## 2023-07-14 NOTE — Assessment & Plan Note (Addendum)
Ear irrigation performed.

## 2023-07-14 NOTE — Assessment & Plan Note (Addendum)
The patient reports having undergone right Achilles tendon surgery a few months ago and has followed up with his surgeon. He now experiences occasional pain in the right Achilles tendon with increased exertion.  A prescription for prednisone 50 mg daily for 5 days will be provided. In addition, the patient is encouraged to take Tylenol as needed for pain relief and to avoid activities that may exacerbate the condition, such as running, jumping, or prolonged standing

## 2023-07-17 ENCOUNTER — Encounter: Payer: Self-pay | Admitting: Family Medicine

## 2023-08-15 ENCOUNTER — Ambulatory Visit: Payer: Managed Care, Other (non HMO) | Admitting: Family Medicine

## 2023-08-15 ENCOUNTER — Other Ambulatory Visit: Payer: Self-pay | Admitting: Family Medicine

## 2023-08-15 VITALS — BP 146/94 | HR 100 | Temp 97.9°F | Ht 74.0 in | Wt 219.4 lb

## 2023-08-15 DIAGNOSIS — G4700 Insomnia, unspecified: Secondary | ICD-10-CM | POA: Diagnosis not present

## 2023-08-15 DIAGNOSIS — I10 Essential (primary) hypertension: Secondary | ICD-10-CM

## 2023-08-15 MED ORDER — AMLODIPINE-OLMESARTAN 10-20 MG PO TABS
1.0000 | ORAL_TABLET | Freq: Every day | ORAL | 1 refills | Status: DC
Start: 2023-08-15 — End: 2023-08-15

## 2023-08-15 MED ORDER — TRAZODONE HCL 50 MG PO TABS
ORAL_TABLET | ORAL | 2 refills | Status: DC
Start: 2023-08-15 — End: 2024-01-18

## 2023-08-15 NOTE — Progress Notes (Signed)
Established Patient Office Visit  Subjective:  Patient ID: Wayne Weber, male    DOB: January 14, 1971  Age: 52 y.o. MRN: [id number]  CC: No chief complaint on file.   HPI Wayne Weber is a 52 y.o. male with past medical history of hypertension presents for f/u.  For the details of today's visit, please refer to the assessment and plan.    Past Medical History:  Diagnosis Date   Contact dermatitis 09/03/2013   Hypertension    Muscle weakness (generalized) 11/23/2012   Pain in joint, shoulder region 11/23/2012   Rectal bleeding     Past Surgical History:  Procedure Laterality Date   ACHILLES TENDON REPAIR     ROTATOR CUFF REPAIR     TONSILLECTOMY      Family History  Problem Relation Age of Onset   Diabetes Mother    Hypertension Mother    Thyroid disease Mother    Diabetes Father    Heart disease Father     Social History   Socioeconomic History   Marital status: Single    Spouse name: Not on file   Number of children: Not on file   Years of education: Not on file   Highest education level: Not on file  Occupational History   Not on file  Tobacco Use   Smoking status: Former    Current packs/day: 0.00    Average packs/day: 1 pack/day for 5.0 years (5.0 ttl pk-yrs)    Types: Cigarettes    Start date: 09/16/2015    Quit date: 09/15/2020    Years since quitting: 2.9   Smokeless tobacco: Never  Vaping Use   Vaping status: Never Used  Substance and Sexual Activity   Alcohol use: Yes    Comment: pabst blue ribbon, jack daniels- a fifth lasts 4 days   Drug use: No   Sexual activity: Yes    Comment: multiple partners, not regularly using condoms  Other Topics Concern   Not on file  Social History Narrative   Not on file   Social Determinants of Health   Financial Resource Strain: Not on file  Food Insecurity: Not on file  Transportation Needs: Not on file  Physical Activity: Sufficiently Active (09/08/2018)   Exercise Vital Sign    Days of  Exercise per Week: 5 days    Minutes of Exercise per Session: 30 min  Stress: Not on file  Social Connections: Not on file  Intimate Partner Violence: Not on file    Outpatient Medications Prior to Visit  Medication Sig Dispense Refill   atorvastatin (LIPITOR) 80 MG tablet Take 1 tablet (80 mg total) by mouth daily. 90 tablet 3   carbamide peroxide (DEBROX) 6.5 % OTIC solution Place 5 drops into the right ear 2 (two) times daily. 15 mL 0   Multiple Vitamin (MULTIVITAMIN WITH MINERALS) TABS tablet Take 1 tablet by mouth daily.     Omega-3 Fatty Acids (FISH OIL PO) Take 1 capsule by mouth daily.     predniSONE (DELTASONE) 50 MG tablet Take 1 tablet daily with breakfast for 5 days. 5 tablet 0   sildenafil (VIAGRA) 100 MG tablet Take 0.5 tablets (50 mg total) by mouth daily as needed for erectile dysfunction. 5 tablet 11   Vitamin D, Ergocalciferol, (DRISDOL) 1.25 MG (50000 UNIT) CAPS capsule Take 1 capsule (50,000 Units total) by mouth every 7 (seven) days. 5 capsule 1   amLODipine (NORVASC) 10 MG tablet Take 1 tablet (10 mg total) by mouth  daily. 90 tablet 1   traZODone (DESYREL) 50 MG tablet TAKE 0.5-1 TABLETS BY MOUTH AT BEDTIME AS NEEDED FOR SLEEP. 90 tablet 2   No facility-administered medications prior to visit.    No Known Allergies  ROS Review of Systems  Constitutional:  Negative for fatigue and fever.  Eyes:  Negative for visual disturbance.  Respiratory:  Negative for chest tightness and shortness of breath.   Cardiovascular:  Negative for chest pain and palpitations.  Neurological:  Negative for dizziness and headaches.      Objective:    Physical Exam HENT:     Head: Normocephalic.     Right Ear: External ear normal.     Left Ear: External ear normal.     Nose: No congestion or rhinorrhea.     Mouth/Throat:     Mouth: Mucous membranes are moist.  Cardiovascular:     Rate and Rhythm: Regular rhythm.     Heart sounds: No murmur heard. Pulmonary:     Effort: No  respiratory distress.     Breath sounds: Normal breath sounds.  Neurological:     Mental Status: He is alert.     BP (!) 146/94   Pulse 100   Temp 97.9 F (36.6 C)   Ht 6\' 2"  (1.88 m)   Wt 219 lb 6.4 oz (99.5 kg)   SpO2 97%   BMI 28.17 kg/m  Wt Readings from Last 3 Encounters:  08/15/23 219 lb 6.4 oz (99.5 kg)  07/12/23 215 lb 1.3 oz (97.6 kg)  04/01/22 205 lb (93 kg)    Lab Results  Component Value Date   TSH 1.500 04/01/2022   Lab Results  Component Value Date   WBC 6.9 04/01/2022   HGB 17.3 04/01/2022   HCT 49.5 04/01/2022   MCV 91 04/01/2022   PLT 246 04/01/2022   Lab Results  Component Value Date   NA 141 04/01/2022   K 4.6 04/01/2022   CO2 20 04/01/2022   GLUCOSE 85 04/01/2022   BUN 14 04/01/2022   CREATININE 1.06 04/01/2022   BILITOT 0.4 04/01/2022   ALKPHOS 94 04/01/2022   AST 30 04/01/2022   ALT 43 04/01/2022   PROT 7.3 04/01/2022   ALBUMIN 4.8 04/01/2022   CALCIUM 9.5 04/01/2022   EGFR 85 04/01/2022   Lab Results  Component Value Date   CHOL 295 (H) 04/01/2022   Lab Results  Component Value Date   HDL 54 04/01/2022   Lab Results  Component Value Date   LDLCALC 196 (H) 04/01/2022   Lab Results  Component Value Date   TRIG 232 (H) 04/01/2022   Lab Results  Component Value Date   CHOLHDL 5.5 (H) 04/01/2022   Lab Results  Component Value Date   HGBA1C 5.6 04/01/2022      Assessment & Plan:  Hypertension, essential Assessment & Plan: Hypertension Management  -Your current blood pressure is above the target goal of <140/90 mmHg. To address this, please discontinue taking amlodipine 10 mg daily and start taking olmesartan-amlodipine 20-10 mg daily.  Medication Instructions: Take your blood pressure medication at the same time each day. After taking your medication, check your blood pressure at least an hour later. If your first reading is >140/90 mmHg, wait at least 10 minutes and recheck your blood pressure. Side Effects: In the  initial days of therapy, you may experience dizziness or lightheadedness as your body adjusts to the lower blood pressure; this is expected. Diet and Lifestyle: Adhere to a low-sodium  diet, limiting intake to less than 1500 mg daily, and increase your physical activity. Avoid over-the-counter NSAIDs such as ibuprofen and naproxen while on this medication. Hydration and Nutrition: Stay well-hydrated by drinking at least 64 ounces of water daily. Increase your servings of fruits and vegetables and avoid excessive sodium in your diet. Long-Term Considerations: Uncontrolled hypertension can increase the risk of cardiovascular diseases, including stroke, coronary artery disease, and heart failure. It is crucial to take your medication as prescribed, as high blood pressure often has no symptoms. Please follow these recommendations diligently and monitor your condition as advised. BP Readings from Last 3 Encounters:  08/15/23 (!) 146/94  07/12/23 (!) 152/90  07/02/22 130/81     Orders: -     amLODIPine-Olmesartan; Take 1 tablet by mouth daily.  Dispense: 30 tablet; Refill: 1  Insomnia, unspecified type -     traZODone HCl; TAKE 0.5-1 TABLETS BY MOUTH AT BEDTIME AS NEEDED FOR SLEEP.  Dispense: 90 tablet; Refill: 2  Note: This chart has been completed using Engineer, civil (consulting) software, and while attempts have been made to ensure accuracy, certain words and phrases may not be transcribed as intended.    Follow-up: Return in about 1 month (around 09/14/2023) for BP.   Gilmore Laroche, FNP

## 2023-08-15 NOTE — Assessment & Plan Note (Addendum)
Hypertension Management  -Your current blood pressure is above the target goal of <140/90 mmHg. To address this, please discontinue taking amlodipine 10 mg daily and start taking olmesartan-amlodipine 20-10 mg daily.  Medication Instructions: Take your blood pressure medication at the same time each day. After taking your medication, check your blood pressure at least an hour later. If your first reading is >140/90 mmHg, wait at least 10 minutes and recheck your blood pressure. Side Effects: In the initial days of therapy, you may experience dizziness or lightheadedness as your body adjusts to the lower blood pressure; this is expected. Diet and Lifestyle: Adhere to a low-sodium diet, limiting intake to less than 1500 mg daily, and increase your physical activity. Avoid over-the-counter NSAIDs such as ibuprofen and naproxen while on this medication. Hydration and Nutrition: Stay well-hydrated by drinking at least 64 ounces of water daily. Increase your servings of fruits and vegetables and avoid excessive sodium in your diet. Long-Term Considerations: Uncontrolled hypertension can increase the risk of cardiovascular diseases, including stroke, coronary artery disease, and heart failure. It is crucial to take your medication as prescribed, as high blood pressure often has no symptoms. Please follow these recommendations diligently and monitor your condition as advised. BP Readings from Last 3 Encounters:  08/15/23 (!) 146/94  07/12/23 (!) 152/90  07/02/22 130/81

## 2023-08-15 NOTE — Progress Notes (Signed)
   Subjective:    Patient ID: Wayne Weber, male    DOB: 25-Dec-1970, 52 y.o.   MRN: [id number]  HPI Pt comes in today for a 1 month follow up. Pt needs refill on sleeping medication. PT has no other complaints at this time.   Review of Systems     Objective:   Physical Exam        Assessment & Plan:

## 2023-08-15 NOTE — Patient Instructions (Addendum)
I appreciate the opportunity to provide care to you today!    Follow up:  1 months  Hypertension Management  -Your current blood pressure is above the target goal of <140/90 mmHg. To address this, please discontinue taking amlodipine 10 mg daily and start taking olmesartan-amlodipine 20-10 mg daily.  Medication Instructions: Take your blood pressure medication at the same time each day. After taking your medication, check your blood pressure at least an hour later. If your first reading is >140/90 mmHg, wait at least 10 minutes and recheck your blood pressure. Side Effects: In the initial days of therapy, you may experience dizziness or lightheadedness as your body adjusts to the lower blood pressure; this is expected. Diet and Lifestyle: Adhere to a low-sodium diet, limiting intake to less than 1500 mg daily, and increase your physical activity. Avoid over-the-counter NSAIDs such as ibuprofen and naproxen while on this medication. Hydration and Nutrition: Stay well-hydrated by drinking at least 64 ounces of water daily. Increase your servings of fruits and vegetables and avoid excessive sodium in your diet. Long-Term Considerations: Uncontrolled hypertension can increase the risk of cardiovascular diseases, including stroke, coronary artery disease, and heart failure. It is crucial to take your medication as prescribed, as high blood pressure often has no symptoms. Please follow these recommendations diligently and monitor your condition as advised.    Please continue to a heart-healthy diet and increase your physical activities. Try to exercise for at least five days a week.    It was a pleasure to see you and I look forward to continuing to work together on your health and well-being. Please do not hesitate to call the office if you need care or have questions about your care.  In case of emergency, please visit the Emergency Department for urgent care, or contact our clinic at  [phone] to schedule an appointment. We're here to help you!   Have a wonderful day and week. With Gratitude, Gilmore Laroche MSN, FNP-BC

## 2023-09-14 ENCOUNTER — Ambulatory Visit: Payer: Managed Care, Other (non HMO) | Admitting: Family Medicine

## 2023-09-25 ENCOUNTER — Other Ambulatory Visit: Payer: Self-pay | Admitting: Family Medicine

## 2023-09-25 ENCOUNTER — Encounter: Payer: Self-pay | Admitting: Family Medicine

## 2023-09-25 ENCOUNTER — Ambulatory Visit: Payer: Managed Care, Other (non HMO) | Admitting: Family Medicine

## 2023-09-25 VITALS — BP 142/86 | HR 91 | Ht 74.0 in | Wt 221.0 lb

## 2023-09-25 DIAGNOSIS — I10 Essential (primary) hypertension: Secondary | ICD-10-CM

## 2023-09-25 MED ORDER — AMLODIPINE-OLMESARTAN 10-40 MG PO TABS
1.0000 | ORAL_TABLET | Freq: Every day | ORAL | 3 refills | Status: DC
Start: 2023-09-25 — End: 2023-11-17

## 2023-09-25 NOTE — Progress Notes (Signed)
Established Patient Office Visit  Subjective:  Patient ID: Wayne Weber, male    DOB: [birth year]  Age: 52 y.o. MRN: [id number]  CC:  Chief Complaint  Patient presents with   Care Management    Follow up on blood pressure    HPI Lehman D Wolford is a 52 y.o. male presents for hypertension f/u.   Hypertension: He takes amlodipine-olmesartan 10-20 milligrams daily reports treatment compliance.  Patient is asymptomatic today in clinic.  Past Medical History:  Diagnosis Date   Contact dermatitis 09/03/2013   Hypertension    Muscle weakness (generalized) 11/23/2012   Pain in joint, shoulder region 11/23/2012   Rectal bleeding     Past Surgical History:  Procedure Laterality Date   ACHILLES TENDON REPAIR     ROTATOR CUFF REPAIR     TONSILLECTOMY      Family History  Problem Relation Age of Onset   Diabetes Mother    Hypertension Mother    Thyroid disease Mother    Diabetes Father    Heart disease Father     Social History   Socioeconomic History   Marital status: Single    Spouse name: Not on file   Number of children: Not on file   Years of education: Not on file   Highest education level: Not on file  Occupational History   Not on file  Tobacco Use   Smoking status: Former    Current packs/day: 0.00    Average packs/day: 1 pack/day for 5.0 years (5.0 ttl pk-yrs)    Types: Cigarettes    Start date: 09/16/2015    Quit date: 09/15/2020    Years since quitting: 3.0   Smokeless tobacco: Never  Vaping Use   Vaping status: Never Used  Substance and Sexual Activity   Alcohol use: Yes    Comment: [employer], [employer]- a fifth lasts 4 days   Drug use: No   Sexual activity: Yes    Comment: multiple partners, not regularly using condoms  Other Topics Concern   Not on file  Social History Narrative   Not on file   Social Determinants of Health   Financial Resource Strain: Not on file  Food Insecurity: Not on file  Transportation Needs: Not  on file  Physical Activity: Sufficiently Active (09/08/2018)   Exercise Vital Sign    Days of Exercise per Week: 5 days    Minutes of Exercise per Session: 30 min  Stress: Not on file  Social Connections: Not on file  Intimate Partner Violence: Not on file    Outpatient Medications Prior to Visit  Medication Sig Dispense Refill   atorvastatin (LIPITOR) 80 MG tablet Take 1 tablet (80 mg total) by mouth daily. 90 tablet 3   carbamide peroxide (DEBROX) 6.5 % OTIC solution Place 5 drops into the right ear 2 (two) times daily. 15 mL 0   Multiple Vitamin (MULTIVITAMIN WITH MINERALS) TABS tablet Take 1 tablet by mouth daily.     Omega-3 Fatty Acids (FISH OIL PO) Take 1 capsule by mouth daily.     predniSONE (DELTASONE) 50 MG tablet Take 1 tablet daily with breakfast for 5 days. 5 tablet 0   sildenafil (VIAGRA) 100 MG tablet Take 0.5 tablets (50 mg total) by mouth daily as needed for erectile dysfunction. 5 tablet 11   traZODone (DESYREL) 50 MG tablet TAKE 0.5-1 TABLETS BY MOUTH AT BEDTIME AS NEEDED FOR SLEEP. 90 tablet 2   Vitamin D, Ergocalciferol, (DRISDOL) 1.25 MG (  50000 UNIT) CAPS capsule Take 1 capsule (50,000 Units total) by mouth every 7 (seven) days. 5 capsule 1   amlodipine-olmesartan (AZOR) 10-20 MG tablet TAKE 1 TABLET BY MOUTH EVERY DAY 90 tablet 0   No facility-administered medications prior to visit.    No Known Allergies  ROS Review of Systems  Constitutional:  Negative for fatigue and fever.  Eyes:  Negative for visual disturbance.  Respiratory:  Negative for chest tightness and shortness of breath.   Cardiovascular:  Negative for chest pain and palpitations.  Neurological:  Negative for dizziness and headaches.      Objective:    Physical Exam HENT:     Head: Normocephalic.     Right Ear: External ear normal.     Left Ear: External ear normal.     Nose: No congestion or rhinorrhea.     Mouth/Throat:     Mouth: Mucous membranes are moist.  Cardiovascular:      Rate and Rhythm: Regular rhythm.     Heart sounds: No murmur heard. Pulmonary:     Effort: No respiratory distress.     Breath sounds: Normal breath sounds.  Neurological:     Mental Status: He is alert.     BP (!) 142/86 (BP Location: Left Arm)   Pulse 91   Ht 6\' 2"  (1.88 m)   Wt 221 lb 0.6 oz (100.3 kg)   SpO2 96%   BMI 28.38 kg/m  Wt Readings from Last 3 Encounters:  09/25/23 221 lb 0.6 oz (100.3 kg)  08/15/23 219 lb 6.4 oz (99.5 kg)  07/12/23 215 lb 1.3 oz (97.6 kg)    Lab Results  Component Value Date   TSH 1.500 04/01/2022   Lab Results  Component Value Date   WBC 6.9 04/01/2022   HGB 17.3 04/01/2022   HCT 49.5 04/01/2022   MCV 91 04/01/2022   PLT 246 04/01/2022   Lab Results  Component Value Date   NA 141 04/01/2022   K 4.6 04/01/2022   CO2 20 04/01/2022   GLUCOSE 85 04/01/2022   BUN 14 04/01/2022   CREATININE 1.06 04/01/2022   BILITOT 0.4 04/01/2022   ALKPHOS 94 04/01/2022   AST 30 04/01/2022   ALT 43 04/01/2022   PROT 7.3 04/01/2022   ALBUMIN 4.8 04/01/2022   CALCIUM 9.5 04/01/2022   EGFR 85 04/01/2022   Lab Results  Component Value Date   CHOL 295 (H) 04/01/2022   Lab Results  Component Value Date   HDL 54 04/01/2022   Lab Results  Component Value Date   LDLCALC 196 (H) 04/01/2022   Lab Results  Component Value Date   TRIG 232 (H) 04/01/2022   Lab Results  Component Value Date   CHOLHDL 5.5 (H) 04/01/2022   Lab Results  Component Value Date   HGBA1C 5.6 04/01/2022      Assessment & Plan:  Hypertension, essential Assessment & Plan: Uncontrolled Your current blood pressure is above the target goal of <140/90 mmHg. To address this, please continue taking amlodipine-olmesartan 10-40 mg daily  Medication Instructions: Take your blood pressure medication at the same time each day. After taking your medication, check your blood pressure at least an hour later. If your first reading is >140/90 mmHg, wait at least 10 minutes and  recheck your blood pressure. Side Effects: In the initial days of therapy, you may experience dizziness or lightheadedness as your body adjusts to the lower blood pressure; this is expected. Diet and Lifestyle: Adhere to a  low-sodium diet, limiting intake to less than 1500 mg daily, and increase your physical activity. Avoid over-the-counter NSAIDs such as ibuprofen and naproxen while on this medication. Hydration and Nutrition: Stay well-hydrated by drinking at least 64 ounces of water daily. Increase your servings of fruits and vegetables and avoid excessive sodium in your diet. Long-Term Considerations: Uncontrolled hypertension can increase the risk of cardiovascular diseases, including stroke, coronary artery disease, and heart failure.  Please report to the emergency department if your blood pressure exceeds 180/120 and is accompanied by symptoms such as headaches, chest pain, palpitations, blurred vision, or dizziness.   BP Readings from Last 3 Encounters:  09/25/23 (!) 142/86  08/15/23 (!) 146/94  07/12/23 (!) 152/90     Orders: -     amLODIPine-Olmesartan; Take 1 tablet by mouth daily.  Dispense: 30 tablet; Refill: 3  Note: This chart has been completed using [employer] (consulting) software, and while attempts have been made to ensure accuracy, certain words and phrases may not be transcribed as intended.    Follow-up: Return in about 1 month (around 10/26/2023).   Gilmore Laroche, FNP

## 2023-09-25 NOTE — Assessment & Plan Note (Signed)
Uncontrolled Your current blood pressure is above the target goal of <140/90 mmHg. To address this, please continue taking amlodipine-olmesartan 10-40 mg daily  Medication Instructions: Take your blood pressure medication at the same time each day. After taking your medication, check your blood pressure at least an hour later. If your first reading is >140/90 mmHg, wait at least 10 minutes and recheck your blood pressure. Side Effects: In the initial days of therapy, you may experience dizziness or lightheadedness as your body adjusts to the lower blood pressure; this is expected. Diet and Lifestyle: Adhere to a low-sodium diet, limiting intake to less than 1500 mg daily, and increase your physical activity. Avoid over-the-counter NSAIDs such as ibuprofen and naproxen while on this medication. Hydration and Nutrition: Stay well-hydrated by drinking at least 64 ounces of water daily. Increase your servings of fruits and vegetables and avoid excessive sodium in your diet. Long-Term Considerations: Uncontrolled hypertension can increase the risk of cardiovascular diseases, including stroke, coronary artery disease, and heart failure.  Please report to the emergency department if your blood pressure exceeds 180/120 and is accompanied by symptoms such as headaches, chest pain, palpitations, blurred vision, or dizziness.   BP Readings from Last 3 Encounters:  09/25/23 (!) 142/86  08/15/23 (!) 146/94  07/12/23 (!) 152/90

## 2023-09-25 NOTE — Patient Instructions (Addendum)
I appreciate the opportunity to provide care to you today!    Follow up:  1 month   Hypertension Management  Your current blood pressure is above the target goal of <140/90 mmHg. To address this, please continue taking amlodipine-olmesartan 10-40 mg daily  Medication Instructions: Take your blood pressure medication at the same time each day. After taking your medication, check your blood pressure at least an hour later. If your first reading is >140/90 mmHg, wait at least 10 minutes and recheck your blood pressure. Side Effects: In the initial days of therapy, you may experience dizziness or lightheadedness as your body adjusts to the lower blood pressure; this is expected. Diet and Lifestyle: Adhere to a low-sodium diet, limiting intake to less than 1500 mg daily, and increase your physical activity. Avoid over-the-counter NSAIDs such as ibuprofen and naproxen while on this medication. Hydration and Nutrition: Stay well-hydrated by drinking at least 64 ounces of water daily. Increase your servings of fruits and vegetables and avoid excessive sodium in your diet. Long-Term Considerations: Uncontrolled hypertension can increase the risk of cardiovascular diseases, including stroke, coronary artery disease, and heart failure.  Please report to the emergency department if your blood pressure exceeds 180/120 and is accompanied by symptoms such as headaches, chest pain, palpitations, blurred vision, or dizziness.     Please continue to a heart-healthy diet and increase your physical activities. Try to exercise for at least five days a week.    It was a pleasure to see you and I look forward to continuing to work together on your health and well-being. Please do not hesitate to call the office if you need care or have questions about your care.  In case of emergency, please visit the Emergency Department for urgent care, or contact our clinic at [phone] to schedule an appointment.  We're here to help you!   Have a wonderful day and week. With Gratitude, Gilmore Laroche MSN, FNP-BC

## 2023-09-28 ENCOUNTER — Ambulatory Visit: Payer: Managed Care, Other (non HMO) | Admitting: Family Medicine

## 2023-10-05 ENCOUNTER — Ambulatory Visit: Payer: Managed Care, Other (non HMO) | Attending: Cardiology | Admitting: Cardiology

## 2023-10-05 ENCOUNTER — Encounter: Payer: Self-pay | Admitting: Cardiology

## 2023-10-05 VITALS — BP 134/84 | HR 82 | Ht 74.0 in | Wt 223.8 lb

## 2023-10-05 DIAGNOSIS — R0602 Shortness of breath: Secondary | ICD-10-CM

## 2023-10-05 DIAGNOSIS — I10 Essential (primary) hypertension: Secondary | ICD-10-CM

## 2023-10-05 DIAGNOSIS — E782 Mixed hyperlipidemia: Secondary | ICD-10-CM

## 2023-10-05 NOTE — Addendum Note (Signed)
Addended by: Roseanne Reno on: 10/05/2023 09:34 AM   Modules accepted: Orders

## 2023-10-05 NOTE — Progress Notes (Signed)
Clinical Summary Wayne Weber is a 52 y.o.male seen today as a new consult, referred by NP Zarwolo for the following medical problems.  1.SOB - symptoms after COVID shot around 2021 - can occur at rest or with activity - sedentary lifestyle. Not specifically exertional - no chest pains. No LE edema - no cough, no wheezing. No orthopnea, no PND - history of tobacco off and on x 20 years.  - +weight gain 05/2019 206 lbs, up to 223 lbs.  - EKG today shows SR, no ischemic changes CAD risk factors: HTN,  HLD, tobacco   2.HTN - just took meds this AM prior to arrival   3. HLD  03/2022 TC 295 TG 232 LDL 196 HDL 54 Compliant with meds - has repeat labs pending with pcp    Past Medical History:  Diagnosis Date   Contact dermatitis 09/03/2013   Hypertension    Muscle weakness (generalized) 11/23/2012   Pain in joint, shoulder region 11/23/2012   Rectal bleeding      No Known Allergies   Current Outpatient Medications  Medication Sig Dispense Refill   amLODipine-olmesartan (AZOR) 10-40 MG tablet Take 1 tablet by mouth daily. 30 tablet 3   Omega-3 Fatty Acids (FISH OIL PO) Take 1 capsule by mouth daily.     traZODone (DESYREL) 50 MG tablet TAKE 0.5-1 TABLETS BY MOUTH AT BEDTIME AS NEEDED FOR SLEEP. 90 tablet 2   atorvastatin (LIPITOR) 80 MG tablet Take 1 tablet (80 mg total) by mouth daily. (Patient not taking: Reported on 10/05/2023) 90 tablet 3   carbamide peroxide (DEBROX) 6.5 % OTIC solution Place 5 drops into the right ear 2 (two) times daily. (Patient not taking: Reported on 10/05/2023) 15 mL 0   Multiple Vitamin (MULTIVITAMIN WITH MINERALS) TABS tablet Take 1 tablet by mouth daily. (Patient not taking: Reported on 10/05/2023)     predniSONE (DELTASONE) 50 MG tablet Take 1 tablet daily with breakfast for 5 days. (Patient not taking: Reported on 10/05/2023) 5 tablet 0   sildenafil (VIAGRA) 100 MG tablet Take 0.5 tablets (50 mg total) by mouth daily as needed for  erectile dysfunction. (Patient not taking: Reported on 10/05/2023) 5 tablet 11   Vitamin D, Ergocalciferol, (DRISDOL) 1.25 MG (50000 UNIT) CAPS capsule Take 1 capsule (50,000 Units total) by mouth every 7 (seven) days. (Patient not taking: Reported on 10/05/2023) 5 capsule 1   No current facility-administered medications for this visit.     Past Surgical History:  Procedure Laterality Date   ACHILLES TENDON REPAIR     ROTATOR CUFF REPAIR     TONSILLECTOMY       No Known Allergies    Family History  Problem Relation Age of Onset   Diabetes Mother    Hypertension Mother    Thyroid disease Mother    Diabetes Father    Heart disease Father      Social History Wayne Weber reports that he quit smoking about 3 years ago. His smoking use included cigarettes. He started smoking about 8 years ago. He has a 5 pack-year smoking history. He has never used smokeless tobacco. Wayne Weber reports that he does not currently use alcohol.   Review of Systems CONSTITUTIONAL: No weight loss, fever, chills, weakness or fatigue.  HEENT: Eyes: No visual loss, blurred vision, double vision or yellow sclerae.No hearing loss, sneezing, congestion, runny nose or sore throat.  SKIN: No rash or itching.  CARDIOVASCULAR: per hpi RESPIRATORY: No shortness of breath, cough  or sputum.  GASTROINTESTINAL: No anorexia, nausea, vomiting or diarrhea. No abdominal pain or blood.  GENITOURINARY: No burning on urination, no polyuria NEUROLOGICAL: No headache, dizziness, syncope, paralysis, ataxia, numbness or tingling in the extremities. No change in bowel or bladder control.  MUSCULOSKELETAL: No muscle, back pain, joint pain or stiffness.  LYMPHATICS: No enlarged nodes. No history of splenectomy.  PSYCHIATRIC: No history of depression or anxiety.  ENDOCRINOLOGIC: No reports of sweating, cold or heat intolerance. No polyuria or polydipsia.  Marland Kitchen   Physical Examination Today's Vitals   10/05/23 0855 10/05/23  0924  BP: (!) 152/90 134/84  Pulse: 82   SpO2: 96%   Weight: 223 lb 12.8 oz (101.5 kg)   Height: 6\' 2"  (1.88 m)    Body mass index is 28.73 kg/m.  Gen: resting comfortably, no acute distress HEENT: no scleral icterus, pupils equal round and reactive, no palptable cervical adenopathy,  CV: RRR, no mrg, no jvd Resp: Clear to auscultation bilaterally GI: abdomen is soft, non-tender, non-distended, normal bowel sounds, no hepatosplenomegaly MSK: extremities are warm, no edema.  Skin: warm, no rash Neuro:  no focal deficits Psych: appropriate affect       Assessment and Plan  1.SOB - nonspecific symptoms of SOB, somewhat difficult to gauge due to sedentary lifestyle - could be related to weight gain, smoking history - mulitple CAD risk factors as described above - plan for GXT to better objectively assess functional capacity, evaluate for any underlying possible ischemia. If benign no further cardiac workup would be planned. -baselien EKG show SR, no acute ishcemic changes  2. HTN  Manual recheck essentially at goal, just took meds prior to appointment - continue to monitor  3. HLD - f/u upcoming labs with pcp, goal LDL would be <100      Antoine Poche, M.D.

## 2023-10-05 NOTE — Patient Instructions (Signed)
Medication Instructions:  Your physician recommends that you continue on your current medications as directed. Please refer to the Current Medication list given to you today.  *If you need a refill on your cardiac medications before your next appointment, please call your pharmacy*   Lab Work: None If you have labs (blood work) drawn today and your tests are completely normal, you will receive your results only by: MyChart Message (if you have MyChart) OR A paper copy in the mail If you have any lab test that is abnormal or we need to change your treatment, we will call you to review the results.   Testing/Procedures: Your physician has requested that you have an exercise tolerance test. For further information please visit [email or web address] Please also follow instruction sheet, as given.    Follow-Up: At [facility], you and your health needs are our priority.  As part of our continuing mission to provide you with exceptional heart care, we have created designated Provider Care Teams.  These Care Teams include your primary Cardiologist (physician) and Advanced Practice Providers (APPs -  Physician Assistants and Nurse Practitioners) who all work together to provide you with the care you need, when you need it.  We recommend signing up for the patient portal called "MyChart".  Sign up information is provided on this After Visit Summary.  MyChart is used to connect with patients for Virtual Visits (Telemedicine).  Patients are able to view lab/test results, encounter notes, upcoming appointments, etc.  Non-urgent messages can be sent to your provider as well.   To learn more about what you can do with MyChart, go to [email or web address].    Your next appointment:    Follow up is pending testing results  Provider:   You may see Dina Rich, MD or one of the following Advanced Practice Providers on your designated Care Team:   Randall An, PA-C  Jacolyn Reedy,  [place]     Other Instructions

## 2023-10-18 ENCOUNTER — Ambulatory Visit (HOSPITAL_COMMUNITY)
Admission: RE | Admit: 2023-10-18 | Discharge: 2023-10-18 | Disposition: A | Discharge status: Home / Self Care | Payer: Managed Care, Other (non HMO) | Source: Ambulatory Visit | Attending: Family Medicine | Admitting: Family Medicine

## 2023-10-18 DIAGNOSIS — R0602 Shortness of breath: Secondary | ICD-10-CM | POA: Diagnosis not present

## 2023-10-18 LAB — EXERCISE TOLERANCE TEST
Angina Index: 0
Base ST Depression (mm): 0 mm
Duke Treadmill Score: 7
Estimated workload: 160
Exercise duration (min): 6 min
Exercise duration (sec): 38 s
MPHR: 167 {beats}/min
Peak HR: 160 {beats}/min
Percent HR: 95 %
RPE: 13
Rest HR: 93 {beats}/min
ST Depression (mm): 0 mm

## 2023-10-26 ENCOUNTER — Ambulatory Visit: Payer: Managed Care, Other (non HMO) | Admitting: Family Medicine

## 2023-10-26 ENCOUNTER — Encounter: Payer: Self-pay | Admitting: Family Medicine

## 2023-10-26 VITALS — BP 140/86 | HR 90 | Ht 74.0 in | Wt 225.1 lb

## 2023-10-26 DIAGNOSIS — I10 Essential (primary) hypertension: Secondary | ICD-10-CM

## 2023-10-26 DIAGNOSIS — B349 Viral infection, unspecified: Secondary | ICD-10-CM | POA: Diagnosis not present

## 2023-10-26 MED ORDER — PROMETHAZINE-DM 6.25-15 MG/5ML PO SYRP: 5.0000 mL | ORAL_SOLUTION | Freq: Four times a day (QID) | ORAL | 0 refills | Status: DC | PRN

## 2023-10-26 NOTE — Assessment & Plan Note (Signed)
Blood pressure is uncontrolled in the clinic, likely due to the patient's current health status with a viral upper respiratory infection. Encouraged to get plenty of rest and increase hydration to promote recovery. Advised to follow a low-sodium diet and increase physical activity. Encouraged to continue taking amlodipine-olmesartan 10-40 mg daily.

## 2023-10-26 NOTE — Progress Notes (Signed)
Established Patient Office Visit  Subjective:  Patient ID: Wayne Weber, male    DOB: [birth year]  Age: 52 y.o. MRN: [id number]  CC:  Chief Complaint  Patient presents with   Care Management    1 month f/u for hypertension    HPI Wayne Weber is a 52 y.o. male presents for BP follow-up f/u.  Hypertension: The patient is asymptomatic in the clinic. He reports treatment compliance with amlodipine-olmesartan 10-40 mg daily and notes that he took his blood pressure about 10 minutes ago. He also mentions feeling under the weather.  Viral Upper Respiratory Infection: The patient complains of watery eyes, nasal congestion, and a cough with yellow phlegm, along with a sore, itchy throat. He denies fever, facial pain, pressure, headaches, nausea, vomiting, and diarrhea.   Past Medical History:  Diagnosis Date   Contact dermatitis 09/03/2013   Hypertension    Muscle weakness (generalized) 11/23/2012   Pain in joint, shoulder region 11/23/2012   Rectal bleeding     Past Surgical History:  Procedure Laterality Date   ACHILLES TENDON REPAIR     ROTATOR CUFF REPAIR     TONSILLECTOMY      Family History  Problem Relation Age of Onset   Diabetes Mother    Hypertension Mother    Thyroid disease Mother    Diabetes Father    Heart disease Father     Social History   Socioeconomic History   Marital status: Single    Spouse name: Not on file   Number of children: Not on file   Years of education: Not on file   Highest education level: Not on file  Occupational History   Not on file  Tobacco Use   Smoking status: Former    Current packs/day: 0.00    Average packs/day: 1 pack/day for 5.0 years (5.0 ttl pk-yrs)    Types: Cigarettes    Start date: 09/16/2015    Quit date: 09/15/2020    Years since quitting: 3.1   Smokeless tobacco: Never  Vaping Use   Vaping status: Never Used  Substance and Sexual Activity   Alcohol use: Not Currently    Comment: [employer] blue  ribbon, [place]- a fifth lasts 4 days   Drug use: No   Sexual activity: Yes    Comment: multiple partners, not regularly using condoms  Other Topics Concern   Not on file  Social History Narrative   Not on file   Social Determinants of Health   Financial Resource Strain: Not on file  Food Insecurity: Not on file  Transportation Needs: Not on file  Physical Activity: Sufficiently Active (09/08/2018)   Exercise Vital Sign    Days of Exercise per Week: 5 days    Minutes of Exercise per Session: 30 min  Stress: Not on file  Social Connections: Not on file  Intimate Partner Violence: Not on file    Outpatient Medications Prior to Visit  Medication Sig Dispense Refill   amLODipine-olmesartan (AZOR) 10-40 MG tablet Take 1 tablet by mouth daily. 30 tablet 3   atorvastatin (LIPITOR) 80 MG tablet Take 1 tablet (80 mg total) by mouth daily. 90 tablet 3   carbamide peroxide (DEBROX) 6.5 % OTIC solution Place 5 drops into the right ear 2 (two) times daily. 15 mL 0   Multiple Vitamin (MULTIVITAMIN WITH MINERALS) TABS tablet Take 1 tablet by mouth daily.     Omega-3 Fatty Acids (FISH OIL PO) Take 1 capsule by mouth daily.  predniSONE (DELTASONE) 50 MG tablet Take 1 tablet daily with breakfast for 5 days. 5 tablet 0   sildenafil (VIAGRA) 100 MG tablet Take 0.5 tablets (50 mg total) by mouth daily as needed for erectile dysfunction. 5 tablet 11   traZODone (DESYREL) 50 MG tablet TAKE 0.5-1 TABLETS BY MOUTH AT BEDTIME AS NEEDED FOR SLEEP. 90 tablet 2   Vitamin D, Ergocalciferol, (DRISDOL) 1.25 MG (50000 UNIT) CAPS capsule Take 1 capsule (50,000 Units total) by mouth every 7 (seven) days. 5 capsule 1   No facility-administered medications prior to visit.    No Known Allergies  ROS Review of Systems  Constitutional:  Negative for fatigue and fever.  HENT:  Positive for congestion and sore throat.   Eyes:  Negative for visual disturbance.  Respiratory:  Positive for cough. Negative for  chest tightness and shortness of breath.   Cardiovascular:  Negative for chest pain and palpitations.  Neurological:  Negative for dizziness and headaches.      Objective:    Physical Exam HENT:     Head: Normocephalic.     Right Ear: External ear normal.     Left Ear: External ear normal.     Nose: No congestion or rhinorrhea.     Mouth/Throat:     Mouth: Mucous membranes are moist.  Cardiovascular:     Rate and Rhythm: Regular rhythm.     Heart sounds: No murmur heard. Pulmonary:     Effort: No respiratory distress.     Breath sounds: Normal breath sounds.  Neurological:     Mental Status: He is alert.     BP (!) 140/86 (BP Location: Left Arm)   Pulse 90   Ht 6\' 2"  (1.88 m)   Wt 225 lb 1.9 oz (102.1 kg)   SpO2 96%   BMI 28.90 kg/m  Wt Readings from Last 3 Encounters:  10/26/23 225 lb 1.9 oz (102.1 kg)  10/05/23 223 lb 12.8 oz (101.5 kg)  09/25/23 221 lb 0.6 oz (100.3 kg)    Lab Results  Component Value Date   TSH 1.500 04/01/2022   Lab Results  Component Value Date   WBC 6.9 04/01/2022   HGB 17.3 04/01/2022   HCT 49.5 04/01/2022   MCV 91 04/01/2022   PLT 246 04/01/2022   Lab Results  Component Value Date   NA 141 04/01/2022   K 4.6 04/01/2022   CO2 20 04/01/2022   GLUCOSE 85 04/01/2022   BUN 14 04/01/2022   CREATININE 1.06 04/01/2022   BILITOT 0.4 04/01/2022   ALKPHOS 94 04/01/2022   AST 30 04/01/2022   ALT 43 04/01/2022   PROT 7.3 04/01/2022   ALBUMIN 4.8 04/01/2022   CALCIUM 9.5 04/01/2022   EGFR 85 04/01/2022   Lab Results  Component Value Date   CHOL 295 (H) 04/01/2022   Lab Results  Component Value Date   HDL 54 04/01/2022   Lab Results  Component Value Date   LDLCALC 196 (H) 04/01/2022   Lab Results  Component Value Date   TRIG 232 (H) 04/01/2022   Lab Results  Component Value Date   CHOLHDL 5.5 (H) 04/01/2022   Lab Results  Component Value Date   HGBA1C 5.6 04/01/2022      Assessment & Plan:  Viral  illness Assessment & Plan: Take medication as prescribed. Increase fluids and allow for plenty of rest. Recommend Tylenol as needed for pain, fever, or general discomfort. Warm salt water gargles 3-4 times daily to help with throat  pain or discomfort. Recommend using a humidifier at bedtime during sleep to help with cough and nasal congestion. Follow-up if your symptoms do not improve    Orders: -     Promethazine-DM; Take 5 mLs by mouth 4 (four) times daily as needed.  Dispense: 118 mL; Refill: 0  Hypertension, essential Assessment & Plan: Blood pressure is uncontrolled in the clinic, likely due to the patient's current health status with a viral upper respiratory infection. Encouraged to get plenty of rest and increase hydration to promote recovery. Advised to follow a low-sodium diet and increase physical activity. Encouraged to continue taking amlodipine-olmesartan 10-40 mg daily.    Note: This chart has been completed using Engineer[employer] (consulting) software, and while attempts have been made to ensure accuracy, certain words and phrases may not be transcribed as intended.    Follow-up: Return in about 3 months (around 01/26/2024).   Gilmore Laroche, FNP

## 2023-10-26 NOTE — Assessment & Plan Note (Signed)
 Take medication as prescribed. Increase fluids and allow for plenty of rest. Recommend Tylenol as needed for pain, fever, or general discomfort. Warm salt water gargles 3-4 times daily to help with throat pain or discomfort. Recommend using a humidifier at bedtime during sleep to help with cough and nasal congestion. Follow-up if your symptoms do not improve

## 2023-10-26 NOTE — Patient Instructions (Addendum)
I appreciate the opportunity to provide care to you today!    Follow up:  3 months   Take medication as prescribed. Increase fluids and allow for plenty of rest. Recommend Tylenol  as needed for pain, fever, or general discomfort. Warm salt water gargles 3-4 times daily to help with throat pain or discomfort. Recommend using a humidifier at bedtime during sleep to help with cough and nasal congestion. Follow-up if your symptoms do not improve    Attached with your AVS, you will find valuable resources for self-education. I highly recommend dedicating some time to thoroughly examine them.   Please continue to a heart-healthy diet and increase your physical activities. Try to exercise for at least five days a week.    It was a pleasure to see you and I look forward to continuing to work together on your health and well-being. Please do not hesitate to call the office if you need care or have questions about your care.  In case of emergency, please visit the Emergency Department for urgent care, or contact our clinic at [phone] to schedule an appointment. We're here to help you!   Have a wonderful day and week. With Gratitude, Gilmore Laroche MSN, FNP-BC

## 2023-11-07 ENCOUNTER — Other Ambulatory Visit: Payer: Self-pay | Admitting: Family Medicine

## 2023-11-07 DIAGNOSIS — I10 Essential (primary) hypertension: Secondary | ICD-10-CM

## 2023-11-16 NOTE — Progress Notes (Signed)
Established Patient Office Visit   Subjective  Patient ID: Wayne Weber, male    DOB: 28-Jul-1971  Age: 52 y.o. MRN: [id number]  Chief Complaint  Patient presents with   Acute Visit    B/P concerns since starting b/p medication he has  been having severe dizzy spells, and having sharp throbbing pain in feet and big toes. Congestion and cough due to work environment.     He  has a past medical history of Contact dermatitis (09/03/2013), Hypertension, Muscle weakness (generalized) (11/23/2012), Pain in joint, shoulder region (11/23/2012), and Rectal bleeding.  HPI Patient presents to the clinic due to concerns about a reaction to the combination blood pressure medication amlodipine-olmesartan. Since starting this medication, he has been experiencing severe episodes of dizziness and lightheadedness.   Review of Systems  Constitutional:  Negative for chills and fever.  Eyes:  Negative for blurred vision.  Respiratory:  Negative for shortness of breath.   Cardiovascular:  Negative for chest pain.  Neurological:  Positive for dizziness and headaches.      Objective:     BP (!) 157/94   Pulse 94   Ht 6\' 2"  (1.88 m)   Wt 220 lb 1.3 oz (99.8 kg)   SpO2 96%   BMI 28.26 kg/m  BP Readings from Last 3 Encounters:  11/17/23 (!) 157/94  10/26/23 (!) 140/86  10/05/23 134/84      Physical Exam Vitals reviewed.  Constitutional:      General: He is not in acute distress.    Appearance: Normal appearance. He is not ill-appearing, toxic-appearing or diaphoretic.  HENT:     Head: Normocephalic.     Right Ear: Tympanic membrane normal.     Left Ear: Tympanic membrane normal.  Eyes:     General:        Right eye: No discharge.        Left eye: No discharge.     Conjunctiva/sclera: Conjunctivae normal.     Pupils: Pupils are equal, round, and reactive to light.  Cardiovascular:     Rate and Rhythm: Normal rate.     Pulses: Normal pulses.     Heart sounds: Normal heart sounds.   Pulmonary:     Effort: Pulmonary effort is normal. No respiratory distress.     Breath sounds: Normal breath sounds.  Musculoskeletal:        General: Normal range of motion.     Cervical back: Normal range of motion.  Skin:    General: Skin is warm and dry.     Capillary Refill: Capillary refill takes less than 2 seconds.  Neurological:     Mental Status: He is alert.  Psychiatric:        Mood and Affect: Mood normal.        Behavior: Behavior normal.      No results found for any visits on 11/17/23.  The 10-year ASCVD risk score (Arnett DK, et al., 2019) is: 15.8%    Assessment & Plan:  Primary hypertension -     Losartan Potassium; Take 1 tablet (25 mg total) by mouth daily.  Dispense: 30 tablet; Refill: 2  Hypertension, essential Assessment & Plan: Vitals:   11/17/23 1004 11/17/23 1011  BP: (!) 153/89 (!) 157/94  Discontinue Amlodipine-olmesartan 10-40 mg combination pill. PCP Malachi Bonds verbally agreed Start Losartan 25 mg once daily Follow up in 4 weeks with at home blood pressure to monitor trends with PCP Continued discussion on DASH diet, low sodium diet and  maintain a exercise routine for 150 minutes per week.       Return in about 4 weeks (around 12/15/2023), or if symptoms worsen or fail to improve, for  , re-check blood pressure, hypertension With PCP.   Cruzita Lederer Newman Nip, FNP

## 2023-11-16 NOTE — Patient Instructions (Signed)

## 2023-11-17 ENCOUNTER — Ambulatory Visit: Payer: Managed Care, Other (non HMO) | Admitting: Family Medicine

## 2023-11-17 ENCOUNTER — Encounter: Payer: Self-pay | Admitting: Family Medicine

## 2023-11-17 ENCOUNTER — Other Ambulatory Visit: Payer: Self-pay | Admitting: Family Medicine

## 2023-11-17 VITALS — BP 157/94 | HR 94 | Ht 74.0 in | Wt 220.1 lb

## 2023-11-17 DIAGNOSIS — I10 Essential (primary) hypertension: Secondary | ICD-10-CM | POA: Diagnosis not present

## 2023-11-17 DIAGNOSIS — B349 Viral infection, unspecified: Secondary | ICD-10-CM

## 2023-11-17 MED ORDER — PROMETHAZINE-DM 6.25-15 MG/5ML PO SYRP: 5.0000 mL | ORAL_SOLUTION | Freq: Four times a day (QID) | ORAL | 0 refills | Status: DC | PRN

## 2023-11-17 MED ORDER — LOSARTAN POTASSIUM 25 MG PO TABS: 25.0000 mg | ORAL_TABLET | Freq: Every day | ORAL | 2 refills | Status: DC

## 2023-11-17 NOTE — Assessment & Plan Note (Addendum)
Vitals:   11/17/23 1004 11/17/23 1011  BP: (!) 153/89 (!) 157/94  Discontinue Amlodipine-olmesartan 10-40 mg combination pill. PCP Malachi Bonds verbally agreed Start Losartan 25 mg once daily Follow up in 4 weeks with at home blood pressure to monitor trends with PCP Continued discussion on DASH diet, low sodium diet and maintain a exercise routine for 150 minutes per week.

## 2024-01-17 NOTE — Progress Notes (Signed)
   Established Patient Office Visit   Subjective  Patient ID: Wayne Weber, male    DOB: 1971-09-07  Age: 53 y.o. MRN: [id number]  Chief Complaint  Patient presents with   Hypertension    Pressure is consistently high Medication management    He  has a past medical history of Contact dermatitis (09/03/2013), Hypertension, Muscle weakness (generalized) (11/23/2012), Pain in joint, shoulder region (11/23/2012), and Rectal bleeding.  HPI Patient presents to the clinic for hypertension follow up. For the details of today's visit, please refer to assessment and plan.   Review of Systems  Constitutional:  Negative for chills and fever.  Respiratory:  Negative for shortness of breath.   Cardiovascular:  Negative for chest pain.  Neurological:  Negative for dizziness and headaches.      Objective:     BP 135/85   Pulse 86   Ht 6' 2 (1.88 m)   Wt 220 lb 1.9 oz (99.8 kg)   SpO2 96%   BMI 28.26 kg/m  BP Readings from Last 3 Encounters:  01/18/24 135/85  11/17/23 (!) 157/94  10/26/23 (!) 140/86      Physical Exam Vitals reviewed.  Constitutional:      General: He is not in acute distress.    Appearance: Normal appearance. He is not ill-appearing, toxic-appearing or diaphoretic.  HENT:     Head: Normocephalic.  Eyes:     General:        Right eye: No discharge.        Left eye: No discharge.     Conjunctiva/sclera: Conjunctivae normal.  Cardiovascular:     Rate and Rhythm: Normal rate.     Pulses: Normal pulses.     Heart sounds: Normal heart sounds.  Pulmonary:     Effort: Pulmonary effort is normal. No respiratory distress.     Breath sounds: Normal breath sounds.  Musculoskeletal:        General: Normal range of motion.     Cervical back: Normal range of motion.  Skin:    General: Skin is warm and dry.     Capillary Refill: Capillary refill takes less than 2 seconds.  Neurological:     Mental Status: He is alert.  Psychiatric:        Mood and Affect:  Mood normal.        Behavior: Behavior normal.      No results found for any visits on 01/18/24.  The 10-year ASCVD risk score (Arnett DK, et al., 2019) is: 12.1%    Assessment & Plan:  Hypertension, essential Assessment & Plan: Vitals:   01/18/24 0902 01/18/24 0917 01/18/24 0955  BP: (!) 149/95 138/78 135/85    Continue Losartan  25 mg once daily Continued discussion on DASH diet, low sodium diet and maintain a exercise routine for 150 minutes per week.  Follow up with PCP for physical and at home blood pressure readings to monitor trends     Return if symptoms worsen or fail to improve.   Hilario Kidd Wilhelmena Falter, FNP

## 2024-01-17 NOTE — Patient Instructions (Signed)

## 2024-01-18 ENCOUNTER — Ambulatory Visit: Payer: Managed Care, Other (non HMO) | Admitting: Family Medicine

## 2024-01-18 ENCOUNTER — Encounter: Payer: Self-pay | Admitting: Family Medicine

## 2024-01-18 VITALS — BP 135/85 | HR 86 | Ht 74.0 in | Wt 220.1 lb

## 2024-01-18 DIAGNOSIS — I10 Essential (primary) hypertension: Secondary | ICD-10-CM | POA: Diagnosis not present

## 2024-01-18 NOTE — Assessment & Plan Note (Signed)
 Vitals:   01/18/24 0902 01/18/24 0917 01/18/24 0955  BP: (!) 149/95 138/78 135/85    Continue Losartan  25 mg once daily Continued discussion on DASH diet, low sodium diet and maintain a exercise routine for 150 minutes per week.  Follow up with PCP for physical and at home blood pressure readings to monitor trends

## 2024-01-19 ENCOUNTER — Encounter: Payer: Self-pay | Admitting: Family Medicine

## 2024-02-11 ENCOUNTER — Other Ambulatory Visit: Payer: Self-pay | Admitting: Family Medicine

## 2024-02-11 DIAGNOSIS — I10 Essential (primary) hypertension: Secondary | ICD-10-CM

## 2024-02-22 ENCOUNTER — Ambulatory Visit (INDEPENDENT_AMBULATORY_CARE_PROVIDER_SITE_OTHER): Payer: Managed Care, Other (non HMO) | Admitting: Family Medicine

## 2024-02-22 ENCOUNTER — Encounter: Payer: Self-pay | Admitting: Family Medicine

## 2024-02-22 VITALS — BP 142/82 | HR 94 | Ht 74.0 in | Wt 222.1 lb

## 2024-02-22 DIAGNOSIS — I10 Essential (primary) hypertension: Secondary | ICD-10-CM

## 2024-02-22 DIAGNOSIS — E038 Other specified hypothyroidism: Secondary | ICD-10-CM

## 2024-02-22 DIAGNOSIS — Z1211 Encounter for screening for malignant neoplasm of colon: Secondary | ICD-10-CM

## 2024-02-22 DIAGNOSIS — R7301 Impaired fasting glucose: Secondary | ICD-10-CM | POA: Diagnosis not present

## 2024-02-22 DIAGNOSIS — Z0001 Encounter for general adult medical examination with abnormal findings: Secondary | ICD-10-CM

## 2024-02-22 DIAGNOSIS — E7849 Other hyperlipidemia: Secondary | ICD-10-CM

## 2024-02-22 DIAGNOSIS — E559 Vitamin D deficiency, unspecified: Secondary | ICD-10-CM

## 2024-02-22 MED ORDER — LOSARTAN POTASSIUM-HCTZ 50-12.5 MG PO TABS: 1.0000 | ORAL_TABLET | Freq: Every day | ORAL | 1 refills | Status: DC

## 2024-02-22 NOTE — Patient Instructions (Addendum)
 I appreciate the opportunity to provide care to you today!    Follow up:  1 months  Labs: please stop by the lab today to get your blood drawn (CBC, CMP, TSH, Lipid profile, HgA1c, Vit D)  Hypertension Management  Your current blood pressure is above the target goal of <140/90 mmHg. To address this, please continue taking Losart- hydrochlorothiazide 50-12.5mg  daily   Medication Instructions: Take your blood pressure medication at the same time each day. After taking your medication, check your blood pressure at least an hour later. If your first reading is >140/90 mmHg, wait at least 10 minutes and recheck your blood pressure. Side Effects: In the initial days of therapy, you may experience dizziness or lightheadedness as your body adjusts to the lower blood pressure; this is expected. Diet and Lifestyle: Adhere to a low-sodium diet, limiting intake to less than 1500 mg daily, and increase your physical activity. Avoid over-the-counter NSAIDs such as ibuprofen and naproxen while on this medication. Hydration and Nutrition: Stay well-hydrated by drinking at least 64 ounces of water daily. Increase your servings of fruits and vegetables and avoid excessive sodium in your diet. Long-Term Considerations: Uncontrolled hypertension can increase the risk of cardiovascular diseases, including stroke, coronary artery disease, and heart failure.  Please report to the emergency department if your blood pressure exceeds 180/120 and is accompanied by symptoms such as headaches, chest pain, palpitations, blurred vision, or dizziness.   Referrals today- GI    Attached with your AVS, you will find valuable resources for self-education. I highly recommend dedicating some time to thoroughly examine them.   Please continue to a heart-healthy diet and increase your physical activities. Try to exercise for at least five days a week.    It was a pleasure to see you and I look forward to continuing to  work together on your health and well-being. Please do not hesitate to call the office if you need care or have questions about your care.  In case of emergency, please visit the Emergency Department for urgent care, or contact our clinic at [phone] to schedule an appointment. We're here to help you!   Have a wonderful day and week. With Gratitude, Gilmore Laroche MSN, FNP-BC

## 2024-02-22 NOTE — Assessment & Plan Note (Signed)
 Uncontrolled BP The patient's ambulatory readings are in the 150s systolic and 90s diastolic. Initiating therapy with losartan 50 mg and hydrochlorothiazide 12.5 mg, along with encouragement of lifestyle modifications, including a low-sodium diet and increased physical activity. The patient is encouraged to bring his ambulatory readings to his next appointment. In two reports, the ED notes that his blood pressure is consistently above 180/120, with symptoms of chest pain, palpitations, shortness of breath, blurred vision, and headaches unrelieved by ibuprofen and Tylenol. The patient understands and is aware of the plan of care. BP Readings from Last 3 Encounters:  02/22/24 (!) 142/82  01/18/24 135/85  11/17/23 (!) 157/94

## 2024-02-22 NOTE — Assessment & Plan Note (Signed)

## 2024-02-22 NOTE — Progress Notes (Signed)
 Complete physical exam  Patient: Wayne Weber   DOB: Aug 13, 1971   53 y.o. Male  MRN: [id number]  Subjective:    Chief Complaint  Patient presents with   Annual Exam    Cpe   Hypertension    F/u for htn.     Wayne Weber is a 53 y.o. male who presents today for a complete physical exam. He reports consuming a general diet. Home exercise routine includes walking. He generally feels well. He reports sleeping well. He does have additional problems to discuss today.    Most recent fall risk assessment:    02/22/2024    9:34 AM  Fall Risk   Falls in the past year? 0  Number falls in past yr: 0  Injury with Fall? 0  Risk for fall due to : No Fall Risks  Follow up Falls evaluation completed     Most recent depression screenings:    02/22/2024    9:34 AM 10/26/2023    8:25 AM  PHQ 2/9 Scores  PHQ - 2 Score 0 0  PHQ- 9 Score 0 0    Dental: No current dental problems and Last dental visit:   4 moths ago  Patient Active Problem List   Diagnosis Date Noted   Viral illness 10/26/2023   Pain of right heel 07/14/2023   SOB (shortness of breath) 07/14/2023   Erectile dysfunction 04/01/2022   Tubular adenoma of colon 08/25/2021   Impacted cerumen of right ear 02/15/2021   Screening due 02/01/2021   Encounter for general adult medical examination with abnormal findings 02/01/2021   Primary hypertension 02/01/2021   Insomnia 02/01/2021   Rectal bleeding 06/04/2019   Current every day smoker 09/08/2018   Major depressive disorder with current active episode 09/08/2018   Alcohol use 07/10/2018   Hyperlipidemia 07/10/2018   Status post rotator cuff repair 11/23/2012   Past Medical History:  Diagnosis Date   Contact dermatitis 09/03/2013   Hypertension    Muscle weakness (generalized) 11/23/2012   Pain in joint, shoulder region 11/23/2012   Rectal bleeding    Past Surgical History:  Procedure Laterality Date   ACHILLES TENDON REPAIR     ROTATOR CUFF REPAIR      TONSILLECTOMY     Social History   Tobacco Use   Smoking status: Former    Current packs/day: 0.00    Average packs/day: 1 pack/day for 5.0 years (5.0 ttl pk-yrs)    Types: Cigarettes    Start date: 09/16/2015    Quit date: 09/15/2020    Years since quitting: 3.4   Smokeless tobacco: Never  Vaping Use   Vaping status: Never Used  Substance Use Topics   Alcohol use: Not Currently    Comment: pabst blue ribbon, jack daniels- a fifth lasts 4 days   Drug use: No   Social History   Socioeconomic History   Marital status: Single    Spouse name: Not on file   Number of children: Not on file   Years of education: Not on file   Highest education level: Not on file  Occupational History   Not on file  Tobacco Use   Smoking status: Former    Current packs/day: 0.00    Average packs/day: 1 pack/day for 5.0 years (5.0 ttl pk-yrs)    Types: Cigarettes    Start date: 09/16/2015    Quit date: 09/15/2020    Years since quitting: 3.4   Smokeless tobacco: Never  Vaping Use  Vaping status: Never Used  Substance and Sexual Activity   Alcohol use: Not Currently    Comment: pabst blue ribbon, jack daniels- a fifth lasts 4 days   Drug use: No   Sexual activity: Yes    Comment: multiple partners, not regularly using condoms  Other Topics Concern   Not on file  Social History Narrative   Not on file   Social Drivers of Health   Financial Resource Strain: Not on file  Food Insecurity: Not on file  Transportation Needs: Not on file  Physical Activity: Sufficiently Active (09/08/2018)   Exercise Vital Sign    Days of Exercise per Week: 5 days    Minutes of Exercise per Session: 30 min  Stress: Not on file  Social Connections: Not on file  Intimate Partner Violence: Not on file   Family Status  Relation Name Status   Mother  Alive   Father  Deceased       Mi at age 69    Brother 1 Alive       good health, DM, Diabetic   Other single Alive   Child 2 Alive       good health   No partnership data on file   Family History  Problem Relation Age of Onset   Diabetes Mother    Hypertension Mother    Thyroid disease Mother    Diabetes Father    Heart disease Father    No Known Allergies    Patient Care Team: Gilmore Laroche, FNP as PCP - General (Family Medicine) Wyline Mood, Dorothe Pea, MD as PCP - Cardiology (Cardiology)   Outpatient Medications Prior to Visit  Medication Sig   Omega-3 Fatty Acids (FISH OIL PO) Take 1 capsule by mouth daily.   [DISCONTINUED] losartan (COZAAR) 25 MG tablet TAKE 1 TABLET (25 MG TOTAL) BY MOUTH DAILY.   [DISCONTINUED] atorvastatin (LIPITOR) 80 MG tablet Take 1 tablet (80 mg total) by mouth daily. (Patient not taking: Reported on 01/18/2024)   [DISCONTINUED] carbamide peroxide (DEBROX) 6.5 % OTIC solution Place 5 drops into the right ear 2 (two) times daily. (Patient not taking: Reported on 01/18/2024)   [DISCONTINUED] Multiple Vitamin (MULTIVITAMIN WITH MINERALS) TABS tablet Take 1 tablet by mouth daily. (Patient not taking: Reported on 01/18/2024)   [DISCONTINUED] promethazine-dextromethorphan (PROMETHAZINE-DM) 6.25-15 MG/5ML syrup Take 5 mLs by mouth 4 (four) times daily as needed. (Patient not taking: Reported on 01/18/2024)   No facility-administered medications prior to visit.    Review of Systems  Constitutional:  Negative for chills, fever and malaise/fatigue.  HENT:  Negative for congestion and sinus pain.   Eyes:  Negative for pain, discharge and redness.  Respiratory:  Negative for cough, sputum production and shortness of breath.   Cardiovascular:  Negative for chest pain, palpitations, claudication and leg swelling.  Gastrointestinal:  Negative for diarrhea, heartburn and nausea.  Genitourinary:  Negative for flank pain and frequency.  Musculoskeletal:  Negative for back pain and joint pain.  Skin:  Negative for itching.  Neurological:  Negative for dizziness, seizures and headaches.  Endo/Heme/Allergies:  Negative for  environmental allergies.  Psychiatric/Behavioral:  Negative for memory loss. The patient does not have insomnia.        Objective:    BP (!) 142/82 (BP Location: Left Arm)   Pulse 94   Ht 6\' 2"  (1.88 m)   Wt 222 lb 1.9 oz (100.8 kg)   SpO2 94%   BMI 28.52 kg/m  BP Readings from Last 3 Encounters:  02/22/24 (!) 142/82  01/18/24 135/85  11/17/23 (!) 157/94   Wt Readings from Last 3 Encounters:  02/22/24 222 lb 1.9 oz (100.8 kg)  01/18/24 220 lb 1.9 oz (99.8 kg)  11/17/23 220 lb 1.3 oz (99.8 kg)      Physical Exam HENT:     Head: Normocephalic.     Right Ear: External ear normal.     Left Ear: External ear normal.     Nose: No congestion.     Mouth/Throat:     Mouth: Mucous membranes are moist.  Eyes:     Extraocular Movements: Extraocular movements intact.     Pupils: Pupils are equal, round, and reactive to light.  Cardiovascular:     Rate and Rhythm: Regular rhythm. Bradycardia present.     Heart sounds: No murmur heard. Pulmonary:     Effort: No respiratory distress.     Breath sounds: Normal breath sounds.  Abdominal:     Tenderness: There is no right CVA tenderness or left CVA tenderness.  Musculoskeletal:     Right lower leg: No edema.     Left lower leg: No edema.  Neurological:     Mental Status: He is alert and oriented to person, place, and time.     GCS: GCS eye subscore is 4. GCS verbal subscore is 5. GCS motor subscore is 6.     Cranial Nerves: No facial asymmetry.     Motor: No atrophy.     Coordination: Coordination normal. Finger-Nose-Finger Test normal.     Gait: Gait normal.  Psychiatric:        Judgment: Judgment normal.     No results found for any visits on 02/22/24. Last CBC Lab Results  Component Value Date   WBC 6.9 04/01/2022   HGB 17.3 04/01/2022   HCT 49.5 04/01/2022   MCV 91 04/01/2022   MCH 31.7 04/01/2022   RDW 13.7 04/01/2022   PLT 246 04/01/2022   Last metabolic panel Lab Results  Component Value Date   GLUCOSE  85 04/01/2022   NA 141 04/01/2022   K 4.6 04/01/2022   CL 103 04/01/2022   CO2 20 04/01/2022   BUN 14 04/01/2022   CREATININE 1.06 04/01/2022   EGFR 85 04/01/2022   CALCIUM 9.5 04/01/2022   PROT 7.3 04/01/2022   ALBUMIN 4.8 04/01/2022   LABGLOB 2.5 04/01/2022   AGRATIO 1.9 04/01/2022   BILITOT 0.4 04/01/2022   ALKPHOS 94 04/01/2022   AST 30 04/01/2022   ALT 43 04/01/2022   Last lipids Lab Results  Component Value Date   CHOL 295 (H) 04/01/2022   HDL 54 04/01/2022   LDLCALC 196 (H) 04/01/2022   TRIG 232 (H) 04/01/2022   CHOLHDL 5.5 (H) 04/01/2022   Last hemoglobin A1c Lab Results  Component Value Date   HGBA1C 5.6 04/01/2022   Last thyroid functions Lab Results  Component Value Date   TSH 1.500 04/01/2022   Last vitamin D Lab Results  Component Value Date   VD25OH 9.9 (L) 04/01/2022   Last vitamin B12 and Folate Lab Results  Component Value Date   VITAMINB12 858 05/20/2019        Assessment & Plan:    Routine Health Maintenance and Physical Exam  Immunization History  Administered Date(s) Administered   Moderna Sars-Covid-2 Vaccination 04/02/2020, 11/16/2020   Tdap 12/12/2008, 04/01/2022   Zoster Recombinant(Shingrix) 04/01/2022    Health Maintenance  Topic Date Due   Zoster Vaccines- Shingrix (2 of 2) 05/27/2022  COVID-19 Vaccine (3 - 2024-25 season) 08/13/2023   INFLUENZA VACCINE  03/11/2024 (Originally 07/13/2023)   Pneumococcal Vaccine 60-59 Years old (1 of 2 - PCV) 01/17/2025 (Originally 09/15/1976)   Colonoscopy  10/15/2031   DTaP/Tdap/Td (3 - Td or Tdap) 04/01/2032   Hepatitis C Screening  Completed   HIV Screening  Completed   HPV VACCINES  Aged Out    Discussed health benefits of physical activity, and encouraged him to engage in regular exercise appropriate for his age and condition.  Encounter for general adult medical examination with abnormal findings Assessment & Plan: Physical exam as documented Discussed heart-healthy diet   Encouraged to Exercise: If you are able: 30 -60 minutes a day ,4 days a week, or 150 minutes a week. The longer the better. Combine stretch, strength, and aerobic activities Encourage to eat whole Food, Plant Predominant Nutrition is highly recommended: Eat Plenty of vegetables, Mushrooms, fruits, Legumes, Whole Grains, Nuts, seeds in lieu of processed meats, processed snacks/pastries red meat, poultry, eggs.  Will f/u in 1 year for CPE      Primary hypertension Assessment & Plan: Uncontrolled BP The patient's ambulatory readings are in the 150s systolic and 90s diastolic. Initiating therapy with losartan 50 mg and hydrochlorothiazide 12.5 mg, along with encouragement of lifestyle modifications, including a low-sodium diet and increased physical activity. The patient is encouraged to bring his ambulatory readings to his next appointment. In two reports, the ED notes that his blood pressure is consistently above 180/120, with symptoms of chest pain, palpitations, shortness of breath, blurred vision, and headaches unrelieved by ibuprofen and Tylenol. The patient understands and is aware of the plan of care. BP Readings from Last 3 Encounters:  02/22/24 (!) 142/82  01/18/24 135/85  11/17/23 (!) 157/94     Orders: -     Losartan Potassium-HCTZ; Take 1 tablet by mouth daily.  Dispense: 30 tablet; Refill: 1  Colon cancer screening -     Ambulatory referral to Gastroenterology  IFG (impaired fasting glucose) -     Hemoglobin A1c  Vitamin D deficiency -     VITAMIN D 25 Hydroxy (Vit-D Deficiency, Fractures)  TSH (thyroid-stimulating hormone deficiency) -     TSH + free T4  Other hyperlipidemia -     Lipid panel -     CMP14+EGFR -     CBC with Differential/Platelet  Note: This chart has been completed using Engineer, civil (consulting) software, and while attempts have been made to ensure accuracy, certain words and phrases may not be transcribed as intended.    No follow-ups on  file.     Gilmore Laroche, FNP

## 2024-03-01 ENCOUNTER — Ambulatory Visit
Admission: EM | Admit: 2024-03-01 | Discharge: 2024-03-01 | Disposition: A | Discharge status: Home / Self Care | Payer: Worker's Compensation | Attending: Family Medicine | Admitting: Family Medicine

## 2024-03-01 DIAGNOSIS — S61213A Laceration without foreign body of left middle finger without damage to nail, initial encounter: Secondary | ICD-10-CM

## 2024-03-01 DIAGNOSIS — Z203 Contact with and (suspected) exposure to rabies: Secondary | ICD-10-CM

## 2024-03-01 DIAGNOSIS — S61215A Laceration without foreign body of left ring finger without damage to nail, initial encounter: Secondary | ICD-10-CM | POA: Diagnosis not present

## 2024-03-01 MED ORDER — TETANUS-DIPHTH-ACELL PERTUSSIS 5-2.5-18.5 LF-MCG/0.5 IM SUSY
0.5000 mL | PREFILLED_SYRINGE | Freq: Once | INTRAMUSCULAR | Status: AC
  Administered 2024-03-01: 0.5 mL via INTRAMUSCULAR

## 2024-03-01 NOTE — Discharge Instructions (Addendum)
 If not allergic, you may use over the counter ibuprofen or acetaminophen as needed.

## 2024-03-01 NOTE — ED Provider Notes (Signed)
 [facility]   [id number] 03/01/24 Arrival Time: 1155  ASSESSMENT & PLAN:  1. Laceration of left middle finger without foreign body without damage to nail, initial encounter   2. Laceration of left ring finger without foreign body without damage to nail, initial encounter    Meds ordered this encounter  Medications   Tdap (BOOSTRIX) injection 0.5 mL   Procedure: Laceration Repair Verbal consent obtained. Patient provided with risks and alternatives to the procedure. Wound copiously irrigated with NS then cleansed with betadine. Local anesthesia: Lidocaine 1% without epinephrine to each lacerated finger. Wounds carefully explored. No foreign body, tendon injury, or nonviable tissue were noted. Using sterile technique, 3 interrupted 4-0 Prolene sutures were placed to reapproximate each finger wound wound. Procedure tolerated well. No complications. Minimal bleeding. Advised to look for and return for any signs of infection such as redness, swelling, discharge, or worsening pain. Return for suture removal in 7 days.    Reviewed expectations re: course of current medical issues. Questions answered. Outlined signs and symptoms indicating need for more acute intervention. Patient verbalized understanding. After Visit Summary given.   SUBJECTIVE:  ISIDORE MARGRAF is a 53 y.o. male who presents with a laceration of his LEFT mid dorsal 3rd and 4th fingers; approx 24 hours ago; cut on sharp blade; "still bleeding". Denies pain. No extremity sensation changes or weakness. Did wash wounds well PTA.  Td UTD: No.   OBJECTIVE:  Vitals:   03/01/24 1216  BP: (!) 158/88  Pulse: 79  Resp: 18  Temp: 98.6 F (37 C)  TempSrc: Oral  SpO2: 96%     General appearance: alert; no distress Skin: linear lacerations of his LEFT 3rd and 4th finger, each approx 1 cm;clean wound edges, no foreign bodies; with active bleeding; extensor tendons appears to be undamaged; with FROM of all  fingers; good strength; normal cap refill and distal sensation of all fingers Psychological: alert and cooperative; normal mood and affect  Labs Reviewed - No data to display  No results found.  No Known Allergies  Past Medical History:  Diagnosis Date   Contact dermatitis 09/03/2013   Hypertension    Muscle weakness (generalized) 11/23/2012   Pain in joint, shoulder region 11/23/2012   Rectal bleeding    Social History   Socioeconomic History   Marital status: Single    Spouse name: Not on file   Number of children: Not on file   Years of education: Not on file   Highest education level: Not on file  Occupational History   Not on file  Tobacco Use   Smoking status: Former    Current packs/day: 0.00    Average packs/day: 1 pack/day for 5.0 years (5.0 ttl pk-yrs)    Types: Cigarettes    Start date: 09/16/2015    Quit date: 09/15/2020    Years since quitting: 3.4   Smokeless tobacco: Never  Vaping Use   Vaping status: Never Used  Substance and Sexual Activity   Alcohol use: Not Currently    Comment: pabst blue ribbon, jack daniels- a fifth lasts 4 days   Drug use: No   Sexual activity: Yes    Comment: multiple partners, not regularly using condoms  Other Topics Concern   Not on file  Social History Narrative   Not on file   Social Drivers of Health   Financial Resource Strain: Not on file  Food Insecurity: Not on file  Transportation Needs: Not on file  Physical Activity: Sufficiently Active (  09/08/2018)   Exercise Vital Sign    Days of Exercise per Week: 5 days    Minutes of Exercise per Session: 30 min  Stress: Not on file  Social Connections: Not on file          Mardella Layman, MD 03/01/24 1409

## 2024-03-01 NOTE — ED Triage Notes (Addendum)
 Pt reports he sliced his left ring and middle finger at work x 1 day happened around 11:30 am   Last tetanus unknown

## 2024-03-06 ENCOUNTER — Encounter (INDEPENDENT_AMBULATORY_CARE_PROVIDER_SITE_OTHER): Payer: Self-pay | Admitting: *Deleted

## 2024-03-20 ENCOUNTER — Telehealth (INDEPENDENT_AMBULATORY_CARE_PROVIDER_SITE_OTHER): Payer: Self-pay | Admitting: Gastroenterology

## 2024-03-20 NOTE — Telephone Encounter (Signed)
 Who is your primary care physician: Dr.Zarwolo  Reasons for the colonoscopy:   Have you had a colonoscopy before?  Yes 10/22  Do you have family history of colon cancer? no  Previous colonoscopy with polyps removed? Yes 101/22  Do you have a history colorectal cancer?   no  Are you diabetic? If yes, Type 1 or Type 2?    no  Do you have a prosthetic or mechanical heart valve? no  Do you have a pacemaker/defibrillator?   no  Have you had endocarditis/atrial fibrillation? no  Have you had joint replacement within the last 12 months?  no  Do you tend to be constipated or have to use laxatives? no  Do you have any history of drugs or alchohol?  no  Do you use supplemental oxygen?  no  Have you had a stroke or heart attack within the last 6 months? no  Do you take weight loss medication?  no      Do you take any blood-thinning medications such as: (aspirin, warfarin, Plavix, Aggrenox)  no  If yes we need the name, milligram, dosage and who is prescribing doctor  Current Outpatient Medications on File Prior to Visit  Medication Sig Dispense Refill   losartan-hydrochlorothiazide (HYZAAR) 50-12.5 MG tablet Take 1 tablet by mouth daily. 30 tablet 1   Omega-3 Fatty Acids (FISH OIL PO) Take 1 capsule by mouth daily.     No current facility-administered medications on file prior to visit.    No Known Allergies   Pharmacy: [facility]  Primary Insurance Name: [employer] number where you can be reached: [phone]

## 2024-03-21 NOTE — Telephone Encounter (Signed)
 Left message to return call. Held a spot for 04/08/24

## 2024-03-21 NOTE — Telephone Encounter (Signed)
 Ok to schedule.  Room 1/2  Thanks,  Vista Lawman, MD Gastroenterology and Hepatology [facility]

## 2024-03-22 NOTE — Telephone Encounter (Signed)
 Left message to return call

## 2024-03-25 ENCOUNTER — Encounter (INDEPENDENT_AMBULATORY_CARE_PROVIDER_SITE_OTHER): Payer: Self-pay

## 2024-03-25 NOTE — Telephone Encounter (Signed)
 Left message. Will send letter. Spot held will be unheld

## 2024-04-09 ENCOUNTER — Telehealth (INDEPENDENT_AMBULATORY_CARE_PROVIDER_SITE_OTHER): Payer: Self-pay | Admitting: Gastroenterology

## 2024-04-09 NOTE — Telephone Encounter (Signed)
 I tried to contact pt multiple times and no answer.

## 2024-04-09 NOTE — Telephone Encounter (Signed)
 Pt came to front desk asking about scheduling his colonoscopy. He said he brought his questionnaire by last week. I told him we have several and someone will be contacting him to get him scheduled. He said it's best to call on Friday [phone]

## 2024-05-02 ENCOUNTER — Encounter: Payer: Self-pay | Admitting: Family Medicine

## 2024-05-02 ENCOUNTER — Ambulatory Visit: Admitting: Family Medicine

## 2024-05-02 VITALS — BP 154/89 | HR 74 | Resp 16 | Ht 74.0 in | Wt 222.0 lb

## 2024-05-02 DIAGNOSIS — I10 Essential (primary) hypertension: Secondary | ICD-10-CM | POA: Diagnosis not present

## 2024-05-02 DIAGNOSIS — R0602 Shortness of breath: Secondary | ICD-10-CM

## 2024-05-02 DIAGNOSIS — M79671 Pain in right foot: Secondary | ICD-10-CM

## 2024-05-02 MED ORDER — PREDNISONE 20 MG PO TABS
40.0000 mg | ORAL_TABLET | Freq: Every day | ORAL | 0 refills | Status: AC
Start: 2024-05-02 — End: 2024-05-07

## 2024-05-02 NOTE — Progress Notes (Signed)
 Established Patient Office Visit  Subjective:  Patient ID: Wayne Weber, male    DOB: [birth year]  Age: 53 y.o. MRN: [id number]  CC:  Chief Complaint  Patient presents with   Hypertension    Had been taking the losartan  25mg  until he ran out. He has the 50's and will start taking them in a week when he runs out of the 25   Foot Pain    States he needs some prednisone  for a flare up of pain in his right achilles tendon    HPI Wayne Weber is a 53 y.o. male with past medical history of hypertension being of the right heel presents for BP follow-up. For the details of today's visit, please refer to the assessment and plan.     Past Medical History:  Diagnosis Date   Contact dermatitis 09/03/2013   Hypertension    Muscle weakness (generalized) 11/23/2012   Pain in joint, shoulder region 11/23/2012   Rectal bleeding     Past Surgical History:  Procedure Laterality Date   ACHILLES TENDON REPAIR     ROTATOR CUFF REPAIR     TONSILLECTOMY      Family History  Problem Relation Age of Onset   Diabetes Mother    Hypertension Mother    Thyroid disease Mother    Diabetes Father    Heart disease Father     Social History   Socioeconomic History   Marital status: Single    Spouse name: Not on file   Number of children: Not on file   Years of education: Not on file   Highest education level: Not on file  Occupational History   Not on file  Tobacco Use   Smoking status: Former    Current packs/day: 0.00    Average packs/day: 1 pack/day for 5.0 years (5.0 ttl pk-yrs)    Types: Cigarettes    Start date: 09/16/2015    Quit date: 09/15/2020    Years since quitting: 3.6   Smokeless tobacco: Never  Vaping Use   Vaping status: Never Used  Substance and Sexual Activity   Alcohol use: Not Currently    Comment: [employer] blue ribbon, jack daniels- a fifth lasts 4 days   Drug use: No   Sexual activity: Yes    Comment: multiple partners, not regularly using condoms   Other Topics Concern   Not on file  Social History Narrative   Not on file   Social Drivers of Health   Financial Resource Strain: Not on file  Food Insecurity: Not on file  Transportation Needs: Not on file  Physical Activity: Sufficiently Active (09/08/2018)   Exercise Vital Sign    Days of Exercise per Week: 5 days    Minutes of Exercise per Session: 30 min  Stress: Not on file  Social Connections: Not on file  Intimate Partner Violence: Not on file    Outpatient Medications Prior to Visit  Medication Sig Dispense Refill   losartan -hydrochlorothiazide (HYZAAR) 50-12.5 MG tablet Take 1 tablet by mouth daily. 30 tablet 1   Omega-3 Fatty Acids (FISH OIL PO) Take 1 capsule by mouth daily.     No facility-administered medications prior to visit.    No Known Allergies  ROS Review of Systems  Constitutional:  Negative for fatigue and fever.  Eyes:  Negative for visual disturbance.  Respiratory:  Negative for chest tightness and shortness of breath.   Cardiovascular:  Negative for chest pain and palpitations.  Musculoskeletal:  Right heel pain   Neurological:  Negative for dizziness and headaches.      Objective:     Physical Exam HENT:     Head: Normocephalic.     Right Ear: External ear normal.     Left Ear: External ear normal.     Nose: No congestion or rhinorrhea.     Mouth/Throat:     Mouth: Mucous membranes are moist.  Cardiovascular:     Rate and Rhythm: Regular rhythm.     Heart sounds: No murmur heard. Pulmonary:     Effort: No respiratory distress.     Breath sounds: Normal breath sounds.  Musculoskeletal:     Left foot: Swelling and tenderness present.       Legs:  Neurological:     Mental Status: He is alert.     BP (!) 154/89   Pulse 74   Resp 16   Ht 6\' 2"  (1.88 m)   Wt 222 lb (100.7 kg)   SpO2 96%   BMI 28.50 kg/m  Wt Readings from Last 3 Encounters:  05/02/24 222 lb (100.7 kg)  02/22/24 222 lb 1.9 oz (100.8 kg)   01/18/24 220 lb 1.9 oz (99.8 kg)    Lab Results  Component Value Date   TSH 1.500 04/01/2022   Lab Results  Component Value Date   WBC 6.9 04/01/2022   HGB 17.3 04/01/2022   HCT 49.5 04/01/2022   MCV 91 04/01/2022   PLT 246 04/01/2022   Lab Results  Component Value Date   NA 141 04/01/2022   K 4.6 04/01/2022   CO2 20 04/01/2022   GLUCOSE 85 04/01/2022   BUN 14 04/01/2022   CREATININE 1.06 04/01/2022   BILITOT 0.4 04/01/2022   ALKPHOS 94 04/01/2022   AST 30 04/01/2022   ALT 43 04/01/2022   PROT 7.3 04/01/2022   ALBUMIN 4.8 04/01/2022   CALCIUM  9.5 04/01/2022   EGFR 85 04/01/2022   Lab Results  Component Value Date   CHOL 295 (H) 04/01/2022   Lab Results  Component Value Date   HDL 54 04/01/2022   Lab Results  Component Value Date   LDLCALC 196 (H) 04/01/2022   Lab Results  Component Value Date   TRIG 232 (H) 04/01/2022   Lab Results  Component Value Date   CHOLHDL 5.5 (H) 04/01/2022   Lab Results  Component Value Date   HGBA1C 5.6 04/01/2022      Assessment & Plan:  Primary hypertension Assessment & Plan: Uncontrolled Blood Pressure: The patient's blood pressure is elevated in the clinic today. He reports that he has not yet started the new prescription that was previously called in and has only been taking Losartan  25 mg daily. He is encouraged to start taking Losartan -Hydrochlorothiazide 50/12.5 mg daily as prescribed to better manage his blood pressure. Additionally, the patient was counseled on the importance of a low-sodium diet and increased physical activity to support blood pressure control. The patient is currently asymptomatic in the clinic. A follow-up visit is scheduled in one month to reassess blood pressure and treatment response.    SOB (shortness of breath) Assessment & Plan: The patient continues to experience ongoing symptoms but has been cleared by cardiology. At this time, the patient would like a referral to pulmonary for  further evaluation of lung function. A referral has been placed for a pulmonary function test (PFT) to assist in assessing respiratory status and guiding further management.     Orders: -  Pulmonary Visit  Pain of right heel Assessment & Plan: The patient reports having undergone right Achilles tendon surgery a few months ago and has followed up with his surgeon. He complains of pain in the right Achilles tendon with increased exertion.  A prescription for prednisone  40 mg daily for 5 days will be provided. In addition, the patient is encouraged to take Tylenol  as needed for pain relief and to avoid activities that may exacerbate the condition, such as running, jumping, or prolonged standing  Orders: -     predniSONE ; Take 2 tablets (40 mg total) by mouth daily for 5 days.  Dispense: 10 tablet; Refill: 0  Note: This chart has been completed using Engineer, civil (consulting) software, and while attempts have been made to ensure accuracy, certain words and phrases may not be transcribed as intended.    Follow-up: Return in about 1 month (around 06/02/2024) for BP.   Arshad Oberholzer, FNP

## 2024-05-02 NOTE — Assessment & Plan Note (Addendum)
 The patient reports having undergone right Achilles tendon surgery a few months ago and has followed up with his surgeon. He complains of pain in the right Achilles tendon with increased exertion.  A prescription for prednisone  40 mg daily for 5 days will be provided. In addition, the patient is encouraged to take Tylenol  as needed for pain relief and to avoid activities that may exacerbate the condition, such as running, jumping, or prolonged standing

## 2024-05-02 NOTE — Patient Instructions (Addendum)
 I appreciate the opportunity to provide care to you today!    Follow up:  1 months for Bp   Please pick up your prescription at the pharmacy   Please continue to a heart-healthy diet and increase your physical activities. Try to exercise for at least five days a week.    It was a pleasure to see you and I look forward to continuing to work together on your health and well-being. Please do not hesitate to call the office if you need care or have questions about your care.  In case of emergency, please visit the Emergency Department for urgent care, or contact our clinic at [phone] to schedule an appointment. We're here to help you!   Have a wonderful day and week. With Gratitude, Daylani Deblois MSN, FNP-BC

## 2024-05-02 NOTE — Assessment & Plan Note (Addendum)
 Uncontrolled Blood Pressure: The patient's blood pressure is elevated in the clinic today. He reports that he has not yet started the new prescription that was previously called in and has only been taking Losartan  25 mg daily. He is encouraged to start taking Losartan -Hydrochlorothiazide 50/12.5 mg daily as prescribed to better manage his blood pressure. Additionally, the patient was counseled on the importance of a low-sodium diet and increased physical activity to support blood pressure control. The patient is currently asymptomatic in the clinic. A follow-up visit is scheduled in one month to reassess blood pressure and treatment response.

## 2024-05-02 NOTE — Assessment & Plan Note (Signed)
 The patient continues to experience ongoing symptoms but has been cleared by cardiology. At this time, the patient would like a referral to pulmonary for further evaluation of lung function. A referral has been placed for a pulmonary function test (PFT) to assist in assessing respiratory status and guiding further management.

## 2024-05-08 ENCOUNTER — Ambulatory Visit: Payer: Self-pay | Admitting: Family Medicine

## 2024-05-08 NOTE — Telephone Encounter (Signed)
 Chief Complaint: Shortness of breath x4 years hasn't gotten better or worse  Pertinent Negatives: Patient denies dizziness, chest pain  Disposition: /[x] Urgent Care (no appt availability in office) / [x] Appointment (In office)  Additional Notes: Patient states he started having SOB after getting COVID vaccine 2021. Patient states the SOB has not worsened or gotten better. Pt scheduled for first available appointment to establish care at [facility] on 7/24. This RN advised pt to go to urgent care within three days to be seen for this chronic issue.      Copied from CRM [id number]. Topic: Clinical - Red Word Triage >> May 08, 2024  2:42 PM Wayne Weber wrote: Red Word that prompted transfer to Nurse Triage: Pt is experiencing shortness of breath and "not fully exhaling when breathing" since roughly 2021 (believes it could be from being vaccinated). Pt is a new pt not scheduled with a referral. Reason for Disposition  [1] MODERATE longstanding difficulty breathing (e.g., speaks in phrases, SOB even at rest, pulse 100-120) AND [2] SAME as normal  Answer Assessment - Initial Assessment Questions RESPIRATORY STATUS: "Describe your breathing?" (e.g., wheezing, shortness of breath, unable to speak, severe coughing)      SOB ONSET: "When did this breathing problem begin?"      Started when got COVID vaccine in 2021, not worsened PATTERN "Does the difficult breathing come and go, or has it been constant since it started?"      "Pretty much constant" SEVERITY: "How bad is your breathing?" (e.g., mild, moderate, severe)    - MILD: No SOB at rest, mild SOB with walking, speaks normally in sentences, can lie down, no retractions, pulse < 100.    - MODERATE: SOB at rest, SOB with minimal exertion and prefers to sit, cannot lie down flat, speaks in phrases, mild retractions, audible wheezing, pulse 100-120.    - SEVERE: Very SOB at rest, speaks in single words, struggling to breathe, sitting hunched  forward, retractions, pulse > 120      "All the time" CARDIAC HISTORY: "Do you have any history of heart disease?" (e.g., heart attack, angina, bypass surgery, angioplasty)      Patient had a stress test and came back normal LUNG HISTORY: "Do you have any history of lung disease?"  (e.g., pulmonary embolus, asthma, emphysema)     No CAUSE: "What do you think is causing the breathing problem?"      COVID vaccine  Protocols used: Breathing Difficulty-A-AH

## 2024-05-09 ENCOUNTER — Other Ambulatory Visit: Payer: Self-pay | Admitting: Family Medicine

## 2024-05-09 DIAGNOSIS — G4700 Insomnia, unspecified: Secondary | ICD-10-CM

## 2024-06-06 ENCOUNTER — Encounter: Payer: Self-pay | Admitting: *Deleted

## 2024-06-06 ENCOUNTER — Encounter (INDEPENDENT_AMBULATORY_CARE_PROVIDER_SITE_OTHER): Payer: Self-pay | Admitting: *Deleted

## 2024-06-06 MED ORDER — PEG 3350-KCL-NA BICARB-NACL 420 G PO SOLR
4000.0000 mL | Freq: Once | ORAL | 0 refills | Status: AC
Start: 2024-06-06 — End: 2024-06-06

## 2024-06-06 NOTE — Addendum Note (Signed)
 Addended by: JEANELL GRAEME RAMAN on: 06/06/2024 11:00 AM   Modules accepted: Orders

## 2024-06-06 NOTE — Telephone Encounter (Signed)
 Pt came into the office to schedule his colonoscopy. Placed for 7/3. Gave instructions while in office. Made aware also will receive a pre-op phone call 1-2 days before procedure with arrival time. Rx for prep sent to pharmacy

## 2024-06-06 NOTE — Telephone Encounter (Signed)
 Referral completed, TCS apt letter sent to PCP

## 2024-06-07 ENCOUNTER — Telehealth (INDEPENDENT_AMBULATORY_CARE_PROVIDER_SITE_OTHER): Payer: Self-pay | Admitting: Gastroenterology

## 2024-06-07 NOTE — Telephone Encounter (Signed)
 Patient wants to cancel his procedure on July 3 with Dr Cinderella due to finances.

## 2024-06-07 NOTE — Telephone Encounter (Signed)
 Referral closed

## 2024-06-07 NOTE — Telephone Encounter (Signed)
 Message sent to endo to cancel. He was a triage

## 2024-06-11 ENCOUNTER — Other Ambulatory Visit (HOSPITAL_COMMUNITY)

## 2024-06-13 ENCOUNTER — Encounter (HOSPITAL_COMMUNITY): Admission: RE | Payer: Self-pay | Source: Home / Self Care

## 2024-06-13 ENCOUNTER — Ambulatory Visit (HOSPITAL_COMMUNITY): Admission: RE | Admit: 2024-06-13 | Source: Home / Self Care | Admitting: Gastroenterology

## 2024-06-13 SURGERY — COLONOSCOPY
Anesthesia: Choice

## 2024-06-21 ENCOUNTER — Encounter: Payer: Self-pay | Admitting: Family Medicine

## 2024-06-21 ENCOUNTER — Ambulatory Visit: Admitting: Family Medicine

## 2024-06-21 VITALS — BP 150/81 | HR 84 | Ht 74.0 in | Wt 221.0 lb

## 2024-06-21 DIAGNOSIS — I10 Essential (primary) hypertension: Secondary | ICD-10-CM | POA: Diagnosis not present

## 2024-06-21 DIAGNOSIS — L309 Dermatitis, unspecified: Secondary | ICD-10-CM | POA: Diagnosis not present

## 2024-06-21 MED ORDER — TRIAMCINOLONE ACETONIDE 0.1 % EX CREA: 1.0000 | TOPICAL_CREAM | Freq: Two times a day (BID) | CUTANEOUS | 0 refills | Status: AC

## 2024-06-21 MED ORDER — OLMESARTAN MEDOXOMIL-HCTZ 40-12.5 MG PO TABS: 1.0000 | ORAL_TABLET | Freq: Every day | ORAL | 1 refills | Status: DC

## 2024-06-21 NOTE — Patient Instructions (Addendum)
 I appreciate the opportunity to provide care to you today!    Follow up:  1 months BP  Hypertension Management  Your current blood pressure is above the target goal of <140/90 mmHg. To address this, please start taking olmesartan - hydrochlortthiazide 40-12.5 mg   Medication Instructions: Take your blood pressure medication at the same time each day. After taking your medication, check your blood pressure at least an hour later. If your first reading is >140/90 mmHg, wait at least 10 minutes and recheck your blood pressure. Side Effects: In the initial days of therapy, you may experience dizziness or lightheadedness as your body adjusts to the lower blood pressure; this is expected. Diet and Lifestyle: Adhere to a low-sodium diet, limiting intake to less than 1500 mg daily, and increase your physical activity. Avoid over-the-counter NSAIDs such as ibuprofen  and naproxen while on this medication. Hydration and Nutrition: Stay well-hydrated by drinking at least 64 ounces of water daily. Increase your servings of fruits and vegetables and avoid excessive sodium in your diet. Long-Term Considerations: Uncontrolled hypertension can increase the risk of cardiovascular diseases, including stroke, coronary artery disease, and heart failure.  Please report to the emergency department if your blood pressure exceeds 180/120 and is accompanied by symptoms such as headaches, chest pain, palpitations, blurred vision, or dizziness.   Dermatitis -Keep your skin moisturized with a Vaseline-based moisturizer to help protect and hydrate the skin. -Start applying Kenalog  0.1% cream to the affected areas as directed to reduce inflammation and itching.    Please follow up if your symptoms worsen or fail to improve.   Please stop by your local pharmacy and get your Tdap and Shingles vaccine  Referrals today-   Attached with your AVS, you will find valuable resources for self-education. I highly recommend  dedicating some time to thoroughly examine them.   Please continue to a heart-healthy diet and increase your physical activities. Try to exercise for at least five days a week.    It was a pleasure to see you and I look forward to continuing to work together on your health and well-being. Please do not hesitate to call the office if you need care or have questions about your care.  In case of emergency, please visit the Emergency Department for urgent care, or contact our clinic at [phone] to schedule an appointment. We're here to help you!   Have a wonderful day and week. With Gratitude, Philena Obey MSN, FNP-BC

## 2024-06-21 NOTE — Assessment & Plan Note (Signed)
 The patient reports a dry, flaky rash on his arms with occasional itching but no redness. He denies recent changes in detergent or soap, and no known history of allergic reactions or childhood eczema. Advised to keep skin moisturized with a Vaseline-based moisturizer. Initiated Kenalog  0.1% cream to affected areas as directed to reduce inflammation and itching.

## 2024-06-21 NOTE — Progress Notes (Signed)
 Acute Office Visit  Subjective:    Patient ID: Wayne Weber, male    DOB: 07-Jul-1971, 53 y.o.   MRN: [id number]  Chief Complaint  Patient presents with   Hypertension    One month follow up    HPI Patient is in today for BP follow up. For the details of today's visit, please refer to the assessment and plan.     Past Medical History:  Diagnosis Date   Contact dermatitis 09/03/2013   Hypertension    Muscle weakness (generalized) 11/23/2012   Pain in joint, shoulder region 11/23/2012   Rectal bleeding     Past Surgical History:  Procedure Laterality Date   ACHILLES TENDON REPAIR     ROTATOR CUFF REPAIR     TONSILLECTOMY      Family History  Problem Relation Age of Onset   Diabetes Mother    Hypertension Mother    Thyroid disease Mother    Diabetes Father    Heart disease Father     Social History   Socioeconomic History   Marital status: Single    Spouse name: Not on file   Number of children: Not on file   Years of education: Not on file   Highest education level: Not on file  Occupational History   Not on file  Tobacco Use   Smoking status: Former    Current packs/day: 0.00    Average packs/day: 1 pack/day for 5.0 years (5.0 ttl pk-yrs)    Types: Cigarettes    Start date: 09/16/2015    Quit date: 09/15/2020    Years since quitting: 3.7   Smokeless tobacco: Never  Vaping Use   Vaping status: Never Used  Substance and Sexual Activity   Alcohol use: Not Currently    Comment: [employer] blue ribbon, [employer] daniels- a fifth lasts 4 days   Drug use: No   Sexual activity: Yes    Comment: multiple partners, not regularly using condoms  Other Topics Concern   Not on file  Social History Narrative   Not on file   Social Drivers of Health   Financial Resource Strain: Not on file  Food Insecurity: Not on file  Transportation Needs: Not on file  Physical Activity: Sufficiently Active (09/08/2018)   Exercise Vital Sign    Days of Exercise per Week: 5  days    Minutes of Exercise per Session: 30 min  Stress: Not on file  Social Connections: Not on file  Intimate Partner Violence: Not on file    Outpatient Medications Prior to Visit  Medication Sig Dispense Refill   losartan -hydrochlorothiazide (HYZAAR) 50-12.5 MG tablet Take 1 tablet by mouth daily. 30 tablet 1   Omega-3 Fatty Acids (FISH OIL PO) Take 1 capsule by mouth daily. (Patient not taking: Reported on 06/06/2024)     No facility-administered medications prior to visit.    No Known Allergies  Review of Systems  Constitutional:  Negative for fatigue and fever.  Eyes:  Negative for visual disturbance.  Respiratory:  Negative for chest tightness and shortness of breath.   Cardiovascular:  Negative for chest pain and palpitations.  Neurological:  Negative for dizziness and headaches.       Objective:    Physical Exam HENT:     Head: Normocephalic.     Right Ear: External ear normal.     Left Ear: External ear normal.     Nose: No congestion or rhinorrhea.     Mouth/Throat:     Mouth:  Mucous membranes are moist.  Cardiovascular:     Rate and Rhythm: Regular rhythm.     Heart sounds: No murmur heard. Pulmonary:     Effort: No respiratory distress.     Breath sounds: Normal breath sounds.  Neurological:     Mental Status: He is alert.     BP (!) 150/81   Pulse 84   Ht 6' 2 (1.88 m)   Wt 221 lb (100.2 kg)   SpO2 97%   BMI 28.37 kg/m  Wt Readings from Last 3 Encounters:  06/21/24 221 lb (100.2 kg)  05/02/24 222 lb (100.7 kg)  02/22/24 222 lb 1.9 oz (100.8 kg)       Assessment & Plan:  Primary hypertension Assessment & Plan: Uncontrolled blood pressure noted in the clinic today. The patient is asymptomatic. Will discontinue losartan -hydrochlorothiazide 50/12.5 mg daily and initiate olmesartan -hydrochlorothiazide 40/12.5 mg daily. Advised a low-sodium diet and increased physical activity.    Orders: -     Olmesartan  Medoxomil-HCTZ; Take 1 tablet by  mouth daily.  Dispense: 30 tablet; Refill: 1  Dermatitis Assessment & Plan: The patient reports a dry, flaky rash on his arms with occasional itching but no redness. He denies recent changes in detergent or soap, and no known history of allergic reactions or childhood eczema. Advised to keep skin moisturized with a Vaseline-based moisturizer. Initiated Kenalog  0.1% cream to affected areas as directed to reduce inflammation and itching.   Orders: -     Triamcinolone  Acetonide; Apply 1 Application topically 2 (two) times daily.  Dispense: 60 g; Refill: 0  Note: This chart has been completed using Engineer, civil (consulting) software, and while attempts have been made to ensure accuracy, certain words and phrases may not be transcribed as intended.    Blaire Hodsdon, FNP

## 2024-06-21 NOTE — Assessment & Plan Note (Signed)
 Uncontrolled blood pressure noted in the clinic today. The patient is asymptomatic. Will discontinue losartan -hydrochlorothiazide 50/12.5 mg daily and initiate olmesartan -hydrochlorothiazide 40/12.5 mg daily. Advised a low-sodium diet and increased physical activity.

## 2024-07-03 NOTE — Progress Notes (Unsigned)
   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     History of Present Illness  07/04/2024  Pulmonary/ 1st office eval/ Wayne Weber / [facility] on prednisone  for achilles tendonitis x 5 courses last one around April 2025  Chief Complaint  Patient presents with   Establish Care  Dyspnea:  limited by by ortho problems more than breathing - no regular ex but very active inside plant no problem with 3 flights and did not get sob on treadmill.  Cough: none  Sleep: flat position / one pillow  SABA use: none  02: none  Sense of doe daily  and concerned about ex so avoid working out     No obvious day to day or daytime pattern/variability or assoc excess/ purulent sputum or mucus plugs or hemoptysis or cp or chest tightness, subjective wheeze or overt sinus or hb symptoms.    Also denies any obvious fluctuation of symptoms with weather or environmental changes or other aggravating or alleviating factors except as outlined above   No unusual exposure hx or h/o childhood pna/ asthma or knowledge of premature birth.  Current Allergies, Complete Past Medical History, Past Surgical History, Family History, and Social History were reviewed in [employer] record.  ROS  The following are not active complaints unless bolded Hoarseness, sore throat, dysphagia, dental problems, itching, sneezing,  nasal congestion or discharge of excess mucus or purulent secretions, ear ache,   fever, chills, sweats, unintended wt loss or wt gain, classically pleuritic or exertional cp,  orthopnea pnd or arm/hand swelling  or leg swelling, presyncope, palpitations, abdominal pain, anorexia, nausea, vomiting, diarrhea  or  change in bowel habits or change in bladder habits, change in stools or change in urine, dysuria, hematuria,  rash, arthralgias, visual complaints, headache, numbness, weakness or ataxia or problems with walking or coordination,  change in mood or  memory.            Outpatient Medications Prior to Visit  Medication Sig Dispense Refill   olmesartan -hydrochlorothiazide (BENICAR  HCT) 40-12.5 MG tablet Take 1 tablet by mouth daily. 30 tablet 1   Omega-3 Fatty Acids (FISH OIL PO) Take 1 capsule by mouth daily.     triamcinolone  cream (KENALOG ) 0.1 % Apply 1 Application topically 2 (two) times daily. 60 g 0   No facility-administered medications prior to visit.    Past Medical History:  Diagnosis Date   Contact dermatitis 09/03/2013   Hypertension    Muscle weakness (generalized) 11/23/2012   Pain in joint, shoulder region 11/23/2012   Rectal bleeding       Objective:     BP (!) 144/95   Pulse 81   Ht 6' 2 (1.88 m)   Wt 221 lb 9.6 oz (100.5 kg)   SpO2 98% Comment: ra  BMI 28.45 kg/m   SpO2: 98 % (ra)  pleasant bm         Assessment   No problem-specific Assessment & Plan notes found for this encounter.     Wayne America, MD 07/04/2024

## 2024-07-04 ENCOUNTER — Ambulatory Visit: Admitting: Internal Medicine

## 2024-07-04 ENCOUNTER — Encounter: Payer: Self-pay | Admitting: Internal Medicine

## 2024-07-04 ENCOUNTER — Telehealth: Payer: Self-pay | Admitting: Internal Medicine

## 2024-07-04 VITALS — BP 144/95 | HR 81 | Ht 74.0 in | Wt 221.6 lb

## 2024-07-04 DIAGNOSIS — R0602 Shortness of breath: Secondary | ICD-10-CM | POA: Diagnosis not present

## 2024-07-04 DIAGNOSIS — I10 Essential (primary) hypertension: Secondary | ICD-10-CM

## 2024-07-04 NOTE — Assessment & Plan Note (Addendum)
 Quit smoking 2021 at wt 89  - GXT  10/18/23  reached [employer] HR s sob/ nl study - 07/04/2024   Walked on RA  x  3  lap(s) =  approx 450  ft  @ mod fast pace, stopped due to end of study with lowest 02 sats 98% and no sob  - PFTs 07/04/2024 >>>  - DOE labs pending   Resting sob s reproduction with ex (eg on GXT) or up sev flights of steps/ absent sleeping s other features such as wheezing or chest discomfort, is typical of hyperventilation syndrome and reassured pt today that other than PFTs and basic doe labs/ cxr nothing else needed for now other than reconditioning at submax ex up to 30 min daily   If not better could do CPST in [place] at some future date but I don't thinks this will be necessary based on initial eval.  Each maintenance medication was reviewed in detail including emphasizing most importantly the difference between maintenance and prns and under what circumstances the prns are to be triggered using an action plan format where appropriate.  Total time for H and P, chart review, counseling,  directly observing portions of ambulatory 02 saturation study/ and generating customized AVS unique to this office visit / same day charting = 48 min new pt eval for refractory respiratory  symptoms of uncertain etiology

## 2024-07-04 NOTE — Patient Instructions (Signed)
 To get the most out of exercise, you need to be continuously aware that you are short of breath, but never out of breath, for at least 30 minutes daily. As you improve, it will actually be easier for you to do the same amount of exercise  in  30 minutes so always push to the level where you are short of breath. This should correct the problem you are having breathing at rest.    Please remember to go to the lab department   for your tests - we will call you with the results when they are available.      Please remember to go to the  x-ray department  @  [facility] for your tests - we will call you with the results when they are available     My office will be contacting you by phone for referral for PFTs and I will contact you with results - if you don't hear back from my office within one week please call us  back or notify us  thru MyChart and we'll address it right away   Follow up will be determined after review of all your studies but we can always see you back here as needed for any worse or new respiratory symptoms

## 2024-07-04 NOTE — Addendum Note (Signed)
 Addended by: RUFFUS LUKES T on: 07/04/2024 09:28 AM   Modules accepted: Orders

## 2024-07-04 NOTE — Assessment & Plan Note (Signed)
 HBP labs ordered as did not previously go to lab for PCP    Each maintenance medication was reviewed in detail including emphasizing most importantly the difference between maintenance and prns and under what circumstances the prns are to be triggered using an action plan format where appropriate.  Total time for H and P, chart review, counseling,  directly observing portions of ambulatory 02 saturation study/ and generating customized AVS unique to this office visit / same day charting = 50 min new pt evall

## 2024-07-04 NOTE — Telephone Encounter (Signed)
 Spoke with patient regarding the Thursday 08/15/24 8:30 am PFT appointment at [facility] time is 8:15 am--1st floor registration desk---will mail appointment information and instructions to patient.  He voiced his understanding

## 2024-07-05 ENCOUNTER — Ambulatory Visit: Payer: Self-pay | Admitting: Internal Medicine

## 2024-07-05 LAB — LIPID PANEL
Chol/HDL Ratio: 4.5 ratio (ref 0.0–5.0)
Cholesterol, Total: 277 mg/dL — ABNORMAL HIGH (ref 100–199)
HDL: 61 mg/dL (ref >39)
LDL Chol Calc (NIH): 174 mg/dL — ABNORMAL HIGH (ref 0–99)
Triglycerides: 227 mg/dL — ABNORMAL HIGH (ref 0–149)
VLDL Cholesterol Cal: 42 mg/dL — ABNORMAL HIGH (ref 5–40)

## 2024-07-06 LAB — CBC WITH DIFFERENTIAL/PLATELET
Basophils Absolute: 0.1 x10E3/uL (ref 0.0–0.2)
Basos: 1 %
EOS (ABSOLUTE): 0.4 x10E3/uL (ref 0.0–0.4)
Eos: 6 %
Hematocrit: 47.8 % (ref 37.5–51.0)
Hemoglobin: 15.6 g/dL (ref 13.0–17.7)
Immature Grans (Abs): 0 x10E3/uL (ref 0.0–0.1)
Immature Granulocytes: 0 %
Lymphocytes Absolute: 2.6 x10E3/uL (ref 0.7–3.1)
Lymphs: 41 %
MCH: 29.8 pg (ref 26.6–33.0)
MCHC: 32.6 g/dL (ref 31.5–35.7)
MCV: 91 fL (ref 79–97)
Monocytes Absolute: 0.7 x10E3/uL (ref 0.1–0.9)
Monocytes: 10 %
Neutrophils Absolute: 2.6 x10E3/uL (ref 1.4–7.0)
Neutrophils: 42 %
Platelets: 257 x10E3/uL (ref 150–450)
RBC: 5.24 x10E6/uL (ref 4.14–5.80)
RDW: 14.5 % (ref 11.6–15.4)
WBC: 6.3 x10E3/uL (ref 3.4–10.8)

## 2024-07-06 LAB — BASIC METABOLIC PANEL WITH GFR
BUN/Creatinine Ratio: 11 (ref 9–20)
BUN: 12 mg/dL (ref 6–24)
CO2: 19 mmol/L — ABNORMAL LOW (ref 20–29)
Calcium: 9.8 mg/dL (ref 8.7–10.2)
Chloride: 104 mmol/L (ref 96–106)
Creatinine, Ser: 1.1 mg/dL (ref 0.76–1.27)
Glucose: 101 mg/dL — ABNORMAL HIGH (ref 70–99)
Potassium: 4.5 mmol/L (ref 3.5–5.2)
Sodium: 144 mmol/L (ref 134–144)
eGFR: 80 mL/min/1.73 (ref >59)

## 2024-07-06 LAB — D-DIMER, QUANTITATIVE: D-DIMER: <0.2 mg{FEU}/L (ref 0.00–0.49)

## 2024-07-06 LAB — SEDIMENTATION RATE: Sed Rate: 3 mm/h (ref 0–30)

## 2024-07-06 LAB — BRAIN NATRIURETIC PEPTIDE: BNP: 5.3 pg/mL (ref 0.0–100.0)

## 2024-07-06 LAB — TSH: TSH: 2.09 u[IU]/mL (ref 0.450–4.500)

## 2024-07-08 NOTE — Progress Notes (Signed)
 I called pt and left detailed msg on machine with results ok per DPR.

## 2024-07-22 ENCOUNTER — Ambulatory Visit (INDEPENDENT_AMBULATORY_CARE_PROVIDER_SITE_OTHER)

## 2024-07-22 DIAGNOSIS — I10 Essential (primary) hypertension: Secondary | ICD-10-CM | POA: Diagnosis not present

## 2024-07-22 NOTE — Progress Notes (Signed)
 Patient is in office today for a nurse visit for Blood Pressure Check. Patient blood pressure was 131/84, Patient No chest pain, No shortness of breath, No dyspnea on exertion, No orthopnea, No paroxysmal nocturnal dyspnea, No edema, No palpitations, No syncope

## 2024-08-06 ENCOUNTER — Ambulatory Visit: Admitting: Podiatry

## 2024-08-06 ENCOUNTER — Encounter: Payer: Self-pay | Admitting: Podiatry

## 2024-08-06 ENCOUNTER — Ambulatory Visit (INDEPENDENT_AMBULATORY_CARE_PROVIDER_SITE_OTHER)

## 2024-08-06 VITALS — Ht 74.0 in | Wt 221.6 lb

## 2024-08-06 DIAGNOSIS — M7751 Other enthesopathy of right foot: Secondary | ICD-10-CM

## 2024-08-06 DIAGNOSIS — M7752 Other enthesopathy of left foot: Secondary | ICD-10-CM | POA: Diagnosis not present

## 2024-08-06 DIAGNOSIS — M205X9 Other deformities of toe(s) (acquired), unspecified foot: Secondary | ICD-10-CM | POA: Diagnosis not present

## 2024-08-06 DIAGNOSIS — M109 Gout, unspecified: Secondary | ICD-10-CM | POA: Diagnosis not present

## 2024-08-06 NOTE — Progress Notes (Signed)
 Subjective:  Patient ID: Wayne Weber, male    DOB: 11/26/1971,  MRN: [id number]  Chief Complaint  Patient presents with   Toe Pain    Rm 1 Patient is here for left toe pain,. Patient states pain has been present for the past three months. Patient states  pain in the metatarsal joints. Was seen at primary care and tested for gout.    Discussed the use of AI scribe software for clinical note transcription with the patient, who gave verbal consent to proceed.  History of Present Illness Wayne Weber is a 53 year old male who presents with excruciating pain in the middle of both big toes. He was referred by his primary physician for evaluation of toe pain.  He has been experiencing excruciating pain in the middle of both big toes for the past three to four months. The pain is described as a burning sensation located at the nail beds of the big toes and is particularly aggravating when moving his toes while lying in bed. No recent injuries, swelling, or redness in the affected areas. He mentions a sensation that feels like a 'ligament or tendon might be loose' in the area, contributing to the discomfort. The pain does not improve with rest and is consistently bothersome.  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 He does not smoke and does not take a lot of pain medication, but he has been using cherry gummies without significant relief. He has tried dietary changes and anti-inflammatories without improvement.      Objective:    Physical Exam VASCULAR: DP and PT pulse palpable. Foot is warm and well-perfused. Capillary fill time is brisk. DERMATOLOGIC: Normal skin turgor, texture, and temperature. No open lesions, rashes, ulcerations, swelling, or redness. NEUROLOGIC: Normal sensation to light touch and pressure. No paresthesias on examination. ORTHOPEDIC: Pain on palpation of dorsal first MTP joint bilaterally with palpable spur.  Smooth pain-free range of motion of all other examined joints. No ecchymosis, bruising, or gross deformity.   No images are attached to the encounter.    Results LABS BMP: Normal renal function (07/04/2024) ESR: Normal (07/04/2024)  RADIOLOGY Radiograph of both feet: Dorsal spurring at the first MTP joint, possible small bunion developing at the great toe distal phalanx bilaterally (08/06/2024)   Assessment:   1. Hallux limitus, unspecified laterality   2. Gout of both feet      Plan:  Patient was evaluated and treated and all questions answered.  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 Pain localized to the dorsal first MTP joint bilaterally with palpable spurs. Radiographs show dorsal spurring at the first MTP joint. Possible gout involvement as well due to similar symptoms and uric acid buildup.  So far OTC NSAIDs and shoe gear changes have not alleviated his pain. - Order lab work to check for gout and uric acid levels.  If positive for gout we will treat with medical therapy. - Discussed surgical nonsurgical treatment options for the bone spurring and arthritic changes.  Majority of cartilage appears to be intact with dorsal spurring only.  Discussed nonoperative treatment with further injection therapy which would be temporary and surgical correction with cheilectomy.  discussed the risks and benefits of surgery including the possibility of infection scar tissue nerve  entrapment or need for further surgery.  He would like to proceed with surgery for cheilectomy of bilateral first MTP joints. - Provide anti-inflammatory medication like Aleve or Motrin  for pain management. - Discuss surgical procedure details and obtain informed consent. - Arrange for post-operative care including walking  shoes and 3 weeks off work.    Surgical plan:  Procedure: - Bilateral cheilectomy  Location: - [facility]  Anesthesia plan: - Sedation with local  Postoperative pain plan: - Tylenol  1000 mg every 6 hours, ibuprofen  600 mg every 6 hours, gabapentin 300 mg every 8 hours x5 days, oxycodone  5 mg 1-2 tabs every 6 hours only as needed  DVT prophylaxis: - None required  WB Restrictions / DME needs: - WBAT in surgical shoe postop    No follow-ups on file.

## 2024-08-07 LAB — CBC WITH DIFFERENTIAL/PLATELET
Basophils Absolute: 0.1 x10E3/uL (ref 0.0–0.2)
Basos: 1 %
EOS (ABSOLUTE): 0.3 x10E3/uL (ref 0.0–0.4)
Eos: 4 %
Hematocrit: 41.7 % (ref 37.5–51.0)
Hemoglobin: 13.9 g/dL (ref 13.0–17.7)
Immature Grans (Abs): 0 x10E3/uL (ref 0.0–0.1)
Immature Granulocytes: 0 %
Lymphocytes Absolute: 2.2 x10E3/uL (ref 0.7–3.1)
Lymphs: 36 %
MCH: 30.2 pg (ref 26.6–33.0)
MCHC: 33.3 g/dL (ref 31.5–35.7)
MCV: 91 fL (ref 79–97)
Monocytes Absolute: 0.6 x10E3/uL (ref 0.1–0.9)
Monocytes: 10 %
Neutrophils Absolute: 3 x10E3/uL (ref 1.4–7.0)
Neutrophils: 49 %
Platelets: 281 x10E3/uL (ref 150–450)
RBC: 4.6 x10E6/uL (ref 4.14–5.80)
RDW: 14 % (ref 11.6–15.4)
WBC: 6.1 x10E3/uL (ref 3.4–10.8)

## 2024-08-07 LAB — BASIC METABOLIC PANEL WITH GFR
BUN/Creatinine Ratio: 19 (ref 9–20)
BUN: 26 mg/dL — ABNORMAL HIGH (ref 6–24)
CO2: 21 mmol/L (ref 20–29)
Calcium: 9.8 mg/dL (ref 8.7–10.2)
Chloride: 105 mmol/L (ref 96–106)
Creatinine, Ser: 1.38 mg/dL — ABNORMAL HIGH (ref 0.76–1.27)
Glucose: 95 mg/dL (ref 70–99)
Potassium: 4.4 mmol/L (ref 3.5–5.2)
Sodium: 141 mmol/L (ref 134–144)
eGFR: 61 mL/min/1.73 (ref >59)

## 2024-08-07 LAB — URIC ACID: Uric Acid: 9.1 mg/dL — ABNORMAL HIGH (ref 3.8–8.4)

## 2024-08-07 LAB — SEDIMENTATION RATE: Sed Rate: 13 mm/h (ref 0–30)

## 2024-08-14 ENCOUNTER — Telehealth: Payer: Self-pay | Admitting: Podiatry

## 2024-08-14 NOTE — Telephone Encounter (Signed)
 DOS- 08/23/2024  CHEILECTOMY + EXPAREL BIL- 71710  [employer] EFFECTIVE DATE- 05/19/2023  DEDUCTIBLE- $1500 REMAINING- $1500 FAMILY DEDUCTIBLE- $3000 REMAINING- $3000 OOP- $3000 REMAINING- $2529.26 FAMILY OOP- $6000 REMAINING- $5529.26 COINSURANCE- 30%  PER FAX RECEIVED FROM [employer], NO PRIOR AUTH IS REQUIRED FOR CPT CODE [id number]. DOCUMENTATION ATTACHED TO SURGERY CONSENT PACKET.

## 2024-08-15 ENCOUNTER — Ambulatory Visit (HOSPITAL_COMMUNITY): Admission: RE | Admit: 2024-08-15 | Source: Ambulatory Visit

## 2024-08-19 ENCOUNTER — Ambulatory Visit: Payer: Self-pay | Admitting: Podiatry

## 2024-08-19 MED ORDER — COLCHICINE 0.6 MG PO TABS: ORAL_TABLET | ORAL | 2 refills | Status: DC

## 2024-08-22 ENCOUNTER — Telehealth: Payer: Self-pay | Admitting: Internal Medicine

## 2024-08-22 ENCOUNTER — Telehealth: Payer: Self-pay | Admitting: Podiatry

## 2024-08-22 DIAGNOSIS — Z0271 Encounter for disability determination: Secondary | ICD-10-CM

## 2024-08-22 NOTE — Telephone Encounter (Signed)
 s/w pt and adv [employer] were recd and will fax [phone] and mail him a copy of what is faxed DOS 08/23/24 and last day at work was 08/20/24. RTW approx 11/11/24- Verf addr on file is correct

## 2024-08-22 NOTE — Telephone Encounter (Signed)
 Spoke with patient to discuss rescheduling the 08/15/24 PFT appointment that he missed---patient states he was told by his provider not to worry about having this study done.

## 2024-08-23 ENCOUNTER — Other Ambulatory Visit: Payer: Self-pay | Admitting: Podiatry

## 2024-08-23 DIAGNOSIS — M216X2 Other acquired deformities of left foot: Secondary | ICD-10-CM | POA: Diagnosis not present

## 2024-08-23 DIAGNOSIS — M216X1 Other acquired deformities of right foot: Secondary | ICD-10-CM | POA: Diagnosis not present

## 2024-08-23 MED ORDER — IBUPROFEN 600 MG PO TABS: 600.0000 mg | ORAL_TABLET | Freq: Four times a day (QID) | ORAL | 0 refills | Status: AC | PRN

## 2024-08-23 MED ORDER — ACETAMINOPHEN 500 MG PO TABS: 1000.0000 mg | ORAL_TABLET | Freq: Four times a day (QID) | ORAL | 0 refills | Status: DC | PRN

## 2024-08-23 MED ORDER — TRAMADOL HCL 50 MG PO TABS: 50.0000 mg | ORAL_TABLET | Freq: Four times a day (QID) | ORAL | 0 refills | Status: DC | PRN

## 2024-08-26 ENCOUNTER — Telehealth: Payer: Self-pay | Admitting: Lab

## 2024-08-26 MED ORDER — HYDROCODONE-ACETAMINOPHEN 5-325 MG PO TABS: 1.0000 | ORAL_TABLET | Freq: Four times a day (QID) | ORAL | 0 refills | Status: AC | PRN

## 2024-08-26 NOTE — Telephone Encounter (Signed)
Patient informed.thanks.

## 2024-08-26 NOTE — Telephone Encounter (Signed)
 Patient called states had surgery and the medication that he is on not helping his pain is requesting something stronger for pain.

## 2024-08-29 ENCOUNTER — Ambulatory Visit (INDEPENDENT_AMBULATORY_CARE_PROVIDER_SITE_OTHER): Payer: Self-pay | Admitting: Podiatry

## 2024-08-29 ENCOUNTER — Ambulatory Visit (INDEPENDENT_AMBULATORY_CARE_PROVIDER_SITE_OTHER)

## 2024-08-29 VITALS — Ht 74.0 in | Wt 221.6 lb

## 2024-08-29 DIAGNOSIS — M7750 Other enthesopathy of unspecified foot: Secondary | ICD-10-CM

## 2024-08-29 DIAGNOSIS — M7751 Other enthesopathy of right foot: Secondary | ICD-10-CM | POA: Diagnosis not present

## 2024-08-29 DIAGNOSIS — M7752 Other enthesopathy of left foot: Secondary | ICD-10-CM | POA: Diagnosis not present

## 2024-08-29 DIAGNOSIS — M205X9 Other deformities of toe(s) (acquired), unspecified foot: Secondary | ICD-10-CM

## 2024-08-29 DIAGNOSIS — M109 Gout, unspecified: Secondary | ICD-10-CM

## 2024-08-29 MED ORDER — COLCHICINE 0.6 MG PO TABS: ORAL_TABLET | ORAL | 2 refills | Status: AC

## 2024-08-29 NOTE — Progress Notes (Signed)
  Subjective:  Patient ID: Wayne Weber, male    DOB: 1971-04-06,  MRN: [id number]  Chief Complaint  Patient presents with   Post-op Follow-up    POV # 1 DOS 08/23/24 BONS SPUR REMOVAL BEHIND BIL GREAT TOES    53 y.o. male returns for post-op check.  Doing well pain is improving  Review of Systems: Negative except as noted in the HPI. Denies N/V/F/Ch.   Objective:  There were no vitals filed for this visit. Body mass index is 28.45 kg/m. Constitutional Well developed. Well nourished.  Vascular Foot warm and well perfused. Capillary refill normal to all digits.  Calf is soft and supple, no posterior calf or knee pain, negative Homans' sign  Neurologic Normal speech. Oriented to person, place, and time. Epicritic sensation to light touch grossly present bilaterally.  Dermatologic Skin healing well without signs of infection. Skin edges well coapted without signs of infection.  Orthopedic: Tenderness to palpation noted about the surgical site.   Multiple view plain film radiographs: Good resection of dorsal first MTP spur Assessment:   1. Bone spur of foot   2. Hallux limitus, unspecified laterality   3. Gout of both feet    Plan:  Patient was evaluated and treated and all questions answered.  S/p foot surgery bilaterally -Progressing as expected post-operatively. -XR: Noted above good resection -WB Status: Weightbearing as tolerated in regular shoe gear may transition out of surgical shoes -Sutures: Return 2 weeks to remove. -Medications: Refill colchicine  sent to pharmacy for his gout -Foot redressed.  Only needs to wear a Band-Aid on the incision site and I encouraged active and passive range of motion of the toe.  No follow-ups on file.

## 2024-08-30 ENCOUNTER — Inpatient Hospital Stay: Payer: Self-pay | Admitting: Family Medicine

## 2024-09-10 ENCOUNTER — Other Ambulatory Visit: Payer: Self-pay

## 2024-09-10 ENCOUNTER — Telehealth: Payer: Self-pay | Admitting: Family Medicine

## 2024-09-10 DIAGNOSIS — I10 Essential (primary) hypertension: Secondary | ICD-10-CM

## 2024-09-10 MED ORDER — OLMESARTAN MEDOXOMIL-HCTZ 40-12.5 MG PO TABS: 1.0000 | ORAL_TABLET | Freq: Every day | ORAL | 1 refills | Status: DC

## 2024-09-10 NOTE — Telephone Encounter (Signed)
 Refill sent to pharmacy.

## 2024-09-10 NOTE — Telephone Encounter (Signed)
 Prescription Request  09/10/2024  LOV: 06/21/2024  What is the name of the medication or equipment?olmesartan -hydrochlorothiazide (BENICAR  HCT) 40-12.5 MG tablet [[id number]]   Have you contacted your pharmacy to request a refill? Yes   Which pharmacy would you like this sent to?  [id number] - [place], [place] - [address] AT [facility] [address] [place] [address] Phone: [phone] Fax: [phone]    Patient notified that their request is being sent to the clinical staff for review and that they should receive a response within 2 business days.   Please advise at walked into office

## 2024-09-11 ENCOUNTER — Other Ambulatory Visit: Payer: Self-pay | Admitting: Family Medicine

## 2024-09-11 DIAGNOSIS — I10 Essential (primary) hypertension: Secondary | ICD-10-CM

## 2024-09-11 NOTE — Telephone Encounter (Signed)
 Copied from CRM [id number]. Topic: Clinical - Medication Refill >> Sep 11, 2024  9:57 AM Everette C wrote: Medication: olmesartan -hydrochlorothiazide (BENICAR  HCT) 40-12.5 MG tablet [[id number]]  Has the patient contacted their pharmacy? Yes (Agent: If no, request that the patient contact the pharmacy for the refill. If patient does not wish to contact the pharmacy document the reason why and proceed with request.) (Agent: If yes, when and what did the pharmacy advise?)  This is the patient's preferred pharmacy:  [id number] - [place], [place] - [address] AT [facility] [address] [place] [address] Phone: [phone] Fax: [phone]  Is this the correct pharmacy for this prescription? Yes If no, delete pharmacy and type the correct one.   Has the prescription been filled recently? Yes  Is the patient out of the medication? Yes  Has the patient been seen for an appointment in the last year OR does the patient have an upcoming appointment? Yes  Can we respond through MyChart? No  Agent: Please be advised that Rx refills may take up to 3 business days. We ask that you follow-up with your pharmacy.

## 2024-09-12 ENCOUNTER — Other Ambulatory Visit: Payer: Self-pay

## 2024-09-12 ENCOUNTER — Ambulatory Visit (INDEPENDENT_AMBULATORY_CARE_PROVIDER_SITE_OTHER): Admitting: Podiatry

## 2024-09-12 ENCOUNTER — Encounter: Payer: Self-pay | Admitting: Podiatry

## 2024-09-12 VITALS — Ht 74.0 in | Wt 221.6 lb

## 2024-09-12 DIAGNOSIS — M109 Gout, unspecified: Secondary | ICD-10-CM

## 2024-09-12 DIAGNOSIS — M205X9 Other deformities of toe(s) (acquired), unspecified foot: Secondary | ICD-10-CM

## 2024-09-12 DIAGNOSIS — I10 Essential (primary) hypertension: Secondary | ICD-10-CM

## 2024-09-12 MED ORDER — OLMESARTAN MEDOXOMIL-HCTZ 40-12.5 MG PO TABS: 1.0000 | ORAL_TABLET | Freq: Every day | ORAL | 1 refills | Status: DC

## 2024-09-12 MED ORDER — PREDNISONE 10 MG PO TABS: ORAL_TABLET | ORAL | 0 refills | Status: AC

## 2024-09-12 NOTE — Progress Notes (Signed)
  Subjective:  Patient ID: Wayne Weber, male    DOB: [birth year],  MRN: [id number]  Chief Complaint  Patient presents with   Post-op Follow-up    RM 21 POV # 2 DOS 08/23/24 BONS SPUR REMOVAL BEHIND BIL GREAT TOES. Moderate swelling in rt ft.    53 y.o. male returns for post-op check.  Doing well still dealing with lots of pain and swelling in the right foot  Review of Systems: Negative except as noted in the HPI. Denies N/V/F/Ch.   Objective:  There were no vitals filed for this visit. Body mass index is 28.45 kg/m. Constitutional Well developed. Well nourished.  Vascular Foot warm and well perfused. Capillary refill normal to all digits.  Calf is soft and supple, no posterior calf or knee pain, negative Homans' sign  Neurologic Normal speech. Oriented to person, place, and time. Epicritic sensation to light touch grossly present bilaterally.  Dermatologic Incisions well-healed not hypertrophic  Orthopedic: Left foot edema and pain is resolving good range of motion of the MTP joint.  He has limited range of motion of the right first MTP joint with still significant edema and swelling   Multiple view plain film radiographs: Good resection of dorsal first MTP spur Assessment:   1. Gout of both feet   2. Hallux limitus, unspecified laterality    Plan:  Patient was evaluated and treated and all questions answered.  S/p foot surgery bilaterally - Sutures removed uneventfully.  Right foot still quite swollen and edematous I suspect he is still dealing with a gouty flare.  Now that his skin is healed we can treat with prednisone  taper Rx for this was sent to pharmacy.  I also recommend we recheck his uric acid level prior to the next visit an order was placed for this.  He will follow-up me in 3 weeks as scheduled  No follow-ups on file.

## 2024-09-17 ENCOUNTER — Telehealth: Payer: Self-pay | Admitting: Podiatry

## 2024-09-17 NOTE — Telephone Encounter (Signed)
 Recd forms from [employer] asking if pt can RTW sooner. Faxed back RTW is still approx 11/11/24

## 2024-09-19 ENCOUNTER — Telehealth: Payer: Self-pay | Admitting: Podiatry

## 2024-09-19 NOTE — Telephone Encounter (Signed)
 pt cld bck and said [employer] needs form and last office notes. I adv he would have to pay 25. he will come friday and have them fax form to me

## 2024-09-19 NOTE — Telephone Encounter (Signed)
 pt lft mess. I called him bk and adv of forms faxed 08/22/24 and his RTW 11/11/24. I adv if they have new forms he will have to pay 25 prior to completion or I can refax the sept forms.

## 2024-09-20 ENCOUNTER — Telehealth: Payer: Self-pay | Admitting: Podiatry

## 2024-09-20 DIAGNOSIS — Z0279 Encounter for issue of other medical certificate: Secondary | ICD-10-CM

## 2024-09-20 NOTE — Telephone Encounter (Signed)
 Pt came and paid 25.00 fee. Faxed [employer] [phone]

## 2024-09-25 LAB — URIC ACID: Uric Acid: 8.7 mg/dL — ABNORMAL HIGH (ref 3.8–8.4)

## 2024-10-03 ENCOUNTER — Ambulatory Visit (INDEPENDENT_AMBULATORY_CARE_PROVIDER_SITE_OTHER): Admitting: Podiatry

## 2024-10-03 ENCOUNTER — Encounter: Payer: Self-pay | Admitting: Podiatry

## 2024-10-03 ENCOUNTER — Ambulatory Visit (INDEPENDENT_AMBULATORY_CARE_PROVIDER_SITE_OTHER)

## 2024-10-03 VITALS — Ht 74.0 in | Wt 221.6 lb

## 2024-10-03 DIAGNOSIS — M778 Other enthesopathies, not elsewhere classified: Secondary | ICD-10-CM

## 2024-10-03 DIAGNOSIS — M7752 Other enthesopathy of left foot: Secondary | ICD-10-CM | POA: Diagnosis not present

## 2024-10-03 DIAGNOSIS — M7751 Other enthesopathy of right foot: Secondary | ICD-10-CM

## 2024-10-03 DIAGNOSIS — M7750 Other enthesopathy of unspecified foot: Secondary | ICD-10-CM

## 2024-10-03 DIAGNOSIS — M109 Gout, unspecified: Secondary | ICD-10-CM

## 2024-10-03 NOTE — Progress Notes (Signed)
  Subjective:  Patient ID: Wayne Weber, male    DOB: May 11, 1971,  MRN: [id number]  Chief Complaint  Patient presents with   Post-op Follow-up    Rm 6 POV # 3 DOS 08/23/24 BONS SPUR REMOVAL BEHIND BIL GREAT TOES. Pt states 75% improvement in pain. Pt states a stinging pain when flexing toes.    53 y.o. male returns for post-op check.  Has improved greatly  Review of Systems: Negative except as noted in the HPI. Denies N/V/F/Ch.   Objective:  There were no vitals filed for this visit. Body mass index is 28.45 kg/m. Constitutional Well developed. Well nourished.  Vascular Foot warm and well perfused. Capillary refill normal to all digits.  Calf is soft and supple, no posterior calf or knee pain, negative Homans' sign  Neurologic Normal speech. Oriented to person, place, and time. Epicritic sensation to light touch grossly present bilaterally.  Dermatologic Incisions well-healed not hypertrophic  Orthopedic: Left foot edema and pain is resolving good range of motion of the MTP joint.  He has limited range of motion of the right first MTP joint with still significant edema and swelling   Uric acid level 8.7, decreased from 9.1  Multiple view plain film radiographs: Good resection of dorsal first MTP spur, no changes in alignment or spur Assessment:   1. Bone spur of foot   2. Gout of both feet    Plan:  Patient was evaluated and treated and all questions answered.  S/p foot surgery bilaterally -Doing well continue shoe gear and range of motion of toes as tolerated.  Okay to return to work if he would like.  New uric acid level ordered encouraged and discussed with his PCP if he should be on chronic medication such as allopurinol as well.  Return to see me in 6 weeks if still tenderness in the joint plan for injection.  Return in about 6 weeks (around 11/14/2024) for post op (no x-rays), gout follow up .

## 2024-10-29 ENCOUNTER — Ambulatory Visit: Payer: Self-pay | Admitting: Internal Medicine

## 2024-10-29 ENCOUNTER — Encounter: Payer: Self-pay | Admitting: Internal Medicine

## 2024-10-29 VITALS — BP 142/82 | HR 85 | Ht 74.0 in | Wt 226.6 lb

## 2024-10-29 DIAGNOSIS — M109 Gout, unspecified: Secondary | ICD-10-CM

## 2024-10-29 DIAGNOSIS — I10 Essential (primary) hypertension: Secondary | ICD-10-CM

## 2024-10-29 DIAGNOSIS — E782 Mixed hyperlipidemia: Secondary | ICD-10-CM

## 2024-10-29 MED ORDER — OLMESARTAN MEDOXOMIL 20 MG PO TABS: 20.0000 mg | ORAL_TABLET | Freq: Every day | ORAL | 1 refills | Status: AC

## 2024-10-29 NOTE — Patient Instructions (Addendum)
 Please start taking Olmesartan  as prescribed.  Please take Colchicine  as needed for gout.  Please continue to follow low salt diet and perform moderate exercise/walking at least 150 mins/week.

## 2024-10-30 DIAGNOSIS — M109 Gout, unspecified: Secondary | ICD-10-CM | POA: Insufficient documentation

## 2024-10-30 NOTE — Assessment & Plan Note (Addendum)
 BP Readings from Last 1 Encounters:  10/29/24 (!) 142/82   Uncontrolled Had to stop olmesartan -HCTZ due to gout flareups, likely due to HCTZ Considering today's BP, will restart only olmesartan  at a lower dose of 20 mg QD Counseled for compliance with the medications Advised DASH diet and moderate exercise/walking, at least 150 mins/week

## 2024-10-30 NOTE — Progress Notes (Signed)
 Acute Office Visit  Subjective:    Patient ID: Wayne Weber, male    DOB: March 29, 1971, 53 y.o.   MRN: [id number]  Chief Complaint  Patient presents with   Gout    Gout concerns. Has been off bp medication for 3 days.     HPI Patient is in today for concern of elevated BP since stopping olmesartan -HCTZ.  He had recurrent gout episodes since starting olmesartan -HCTZ.  His uric acid level has been elevated recently as well.  He denies any headache, dizziness, chest pain, dyspnea or palpitations currently.  He has been evaluated for gout by podiatry, has colchicine  for as needed use during acute flareup.  He has quit alcohol use now for the last few months.  Past Medical History:  Diagnosis Date   Contact dermatitis 09/03/2013   Hypertension    Muscle weakness (generalized) 11/23/2012   Pain in joint, shoulder region 11/23/2012   Rectal bleeding     Past Surgical History:  Procedure Laterality Date   ACHILLES TENDON REPAIR     ROTATOR CUFF REPAIR     TONSILLECTOMY      Family History  Problem Relation Age of Onset   Diabetes Mother    Hypertension Mother    Thyroid disease Mother    Diabetes Father    Heart disease Father     Social History   Socioeconomic History   Marital status: Single    Spouse name: Not on file   Number of children: Not on file   Years of education: Not on file   Highest education level: Not on file  Occupational History   Not on file  Tobacco Use   Smoking status: Former    Current packs/day: 0.00    Average packs/day: 1 pack/day for 5.0 years (5.0 ttl pk-yrs)    Types: Cigarettes    Start date: 09/16/2015    Quit date: 09/15/2020    Years since quitting: 4.1   Smokeless tobacco: Never  Vaping Use   Vaping status: Never Used  Substance and Sexual Activity   Alcohol use: Not Currently    Comment: [employer], [employer]- a fifth lasts 4 days   Drug use: No   Sexual activity: Yes    Comment: multiple partners, not  regularly using condoms  Other Topics Concern   Not on file  Social History Narrative   Not on file   Social Drivers of Health   Financial Resource Strain: Not on file  Food Insecurity: Not on file  Transportation Needs: Not on file  Physical Activity: Sufficiently Active (09/08/2018)   Exercise Vital Sign    Days of Exercise per Week: 5 days    Minutes of Exercise per Session: 30 min  Stress: Not on file  Social Connections: Not on file  Intimate Partner Violence: Not on file    Outpatient Medications Prior to Visit  Medication Sig Dispense Refill   colchicine  0.6 MG tablet Take 1.2mg  (2 tablets) then 0.6mg  (1 tablet) 1 hour after. Then, take 1 tablet every day for 7 days. 10 tablet 2   Omega-3 Fatty Acids (FISH OIL PO) Take 1 capsule by mouth daily.     triamcinolone  cream (KENALOG ) 0.1 % Apply 1 Application topically 2 (two) times daily. 60 g 0   olmesartan -hydrochlorothiazide (BENICAR  HCT) 40-12.5 MG tablet Take 1 tablet by mouth daily. (Patient not taking: Reported on 10/29/2024) 90 tablet 1   No facility-administered medications prior to visit.    No Known  Allergies  Review of Systems  Constitutional:  Negative for chills and fever.  HENT:  Negative for congestion and sore throat.   Eyes:  Negative for pain and discharge.  Respiratory:  Negative for cough and shortness of breath.   Cardiovascular:  Negative for chest pain and palpitations.  Gastrointestinal:  Negative for diarrhea, nausea and vomiting.  Endocrine: Negative for polydipsia and polyuria.  Genitourinary:  Negative for dysuria and hematuria.  Musculoskeletal:  Negative for neck pain and neck stiffness.  Skin:  Negative for rash.  Neurological:  Negative for dizziness, weakness, numbness and headaches.  Psychiatric/Behavioral:  Negative for agitation and behavioral problems.        Objective:    Physical Exam Vitals reviewed.  Constitutional:      General: He is not in acute distress.     Appearance: He is not diaphoretic.  HENT:     Head: Normocephalic and atraumatic.     Nose: Nose normal.     Mouth/Throat:     Mouth: Mucous membranes are moist.  Eyes:     General: No scleral icterus.    Extraocular Movements: Extraocular movements intact.  Cardiovascular:     Rate and Rhythm: Normal rate and regular rhythm.     Heart sounds: Normal heart sounds. No murmur heard. Pulmonary:     Breath sounds: Normal breath sounds. No wheezing or rales.  Musculoskeletal:     Cervical back: Neck supple. No tenderness.     Right lower leg: No edema.     Left lower leg: No edema.  Skin:    General: Skin is warm.     Findings: No rash.  Neurological:     General: No focal deficit present.     Mental Status: He is alert and oriented to person, place, and time.  Psychiatric:        Mood and Affect: Mood normal.        Behavior: Behavior normal.     BP (!) 142/82 (BP Location: Left Arm)   Pulse 85   Ht 6' 2 (1.88 m)   Wt 226 lb 9.6 oz (102.8 kg)   SpO2 98%   BMI 29.09 kg/m  Wt Readings from Last 3 Encounters:  10/29/24 226 lb 9.6 oz (102.8 kg)  10/03/24 221 lb 9.6 oz (100.5 kg)  09/12/24 221 lb 9.6 oz (100.5 kg)        Assessment & Plan:   Problem List Items Addressed This Visit       Cardiovascular and Mediastinum   Primary hypertension - Primary   BP Readings from Last 1 Encounters:  10/29/24 (!) 142/82   Uncontrolled Had to stop olmesartan -HCTZ due to gout flareups, likely due to HCTZ Considering today's BP, will restart only olmesartan  at a lower dose of 20 mg QD Counseled for compliance with the medications Advised DASH diet and moderate exercise/walking, at least 150 mins/week      Relevant Medications   olmesartan  (BENICAR ) 20 MG tablet     Musculoskeletal and Integument   Gout of foot   Has recurrent bilateral foot pain and swelling Uric acid level was elevated recently Will discontinue HCTZ Advised to avoid alcohol use and follow low purine  diet Followed by podiatry - has colchicine  for acute flareup If he has recurrent gout flareups despite stopping HCTZ, will initiate allopurinol        Other   Hyperlipidemia   Last lipid profile reviewed Has significantly elevated LDL - was on statin, has stopped it  due to myalgias Advised to follow DASH diet Recheck lipid profile after 6 months - if LDL elevated, will need to be placed back on statin      Relevant Medications   olmesartan  (BENICAR ) 20 MG tablet     Meds ordered this encounter  Medications   olmesartan  (BENICAR ) 20 MG tablet    Sig: Take 1 tablet (20 mg total) by mouth daily.    Dispense:  90 tablet    Refill:  1     Neriah Brott MARLA Blanch, MD

## 2024-10-30 NOTE — Assessment & Plan Note (Signed)
 Last lipid profile reviewed Has significantly elevated LDL - was on statin, has stopped it due to myalgias Advised to follow DASH diet Recheck lipid profile after 6 months - if LDL elevated, will need to be placed back on statin

## 2024-10-30 NOTE — Assessment & Plan Note (Addendum)
 Has recurrent bilateral foot pain and swelling Uric acid level was elevated recently Will discontinue HCTZ Advised to avoid alcohol use and follow low purine diet Followed by podiatry - has colchicine  for acute flareup If he has recurrent gout flareups despite stopping HCTZ, will initiate allopurinol

## 2024-11-14 ENCOUNTER — Encounter: Admitting: Podiatry

## 2025-04-28 ENCOUNTER — Ambulatory Visit: Admitting: Family Medicine

## 2025-04-30 ENCOUNTER — Encounter

## 2025-05-01 ENCOUNTER — Ambulatory Visit

## 2025-11-03 ENCOUNTER — Ambulatory Visit: Payer: Self-pay
